# Patient Record
Sex: Female | Born: 1950 | Race: Black or African American | Hispanic: No | Marital: Married | Smoking: Former smoker
Health system: Southern US, Community
[De-identification: ages and names within clinical notes are randomized; demographics above are authoritative.]

## PROBLEM LIST (undated history)

## (undated) DIAGNOSIS — E785 Hyperlipidemia, unspecified: Secondary | ICD-10-CM

## (undated) DIAGNOSIS — D649 Anemia, unspecified: Secondary | ICD-10-CM

## (undated) DIAGNOSIS — I10 Essential (primary) hypertension: Secondary | ICD-10-CM

## (undated) DIAGNOSIS — M1990 Unspecified osteoarthritis, unspecified site: Secondary | ICD-10-CM

## (undated) HISTORY — PX: CHOLECYSTECTOMY: SHX55

## (undated) HISTORY — DX: Anemia, unspecified: D64.9

## (undated) HISTORY — PX: NECK SURGERY: SHX720

## (undated) HISTORY — PX: BREAST CYST EXCISION: SHX579

## (undated) HISTORY — PX: CARPAL TUNNEL RELEASE: SHX101

## (undated) HISTORY — DX: Hyperlipidemia, unspecified: E78.5

## (undated) HISTORY — DX: Unspecified osteoarthritis, unspecified site: M19.90

## (undated) HISTORY — PX: KNEE ARTHROSCOPY: SUR90

---

## 1997-02-18 ENCOUNTER — Ambulatory Visit (HOSPITAL_COMMUNITY): Admission: RE | Admit: 1997-02-18 | Discharge: 1997-02-18 | Payer: Self-pay | Admitting: Family Medicine

## 1997-03-02 ENCOUNTER — Ambulatory Visit (HOSPITAL_COMMUNITY): Admission: RE | Admit: 1997-03-02 | Discharge: 1997-03-02 | Payer: Self-pay | Admitting: Family Medicine

## 2000-01-22 ENCOUNTER — Ambulatory Visit (HOSPITAL_COMMUNITY): Admission: RE | Admit: 2000-01-22 | Discharge: 2000-01-22 | Payer: Self-pay | Admitting: Family Medicine

## 2000-01-22 ENCOUNTER — Encounter: Payer: Self-pay | Admitting: Family Medicine

## 2003-03-23 ENCOUNTER — Encounter: Admission: RE | Admit: 2003-03-23 | Discharge: 2003-06-21 | Payer: Self-pay | Admitting: Family Medicine

## 2003-05-04 ENCOUNTER — Ambulatory Visit (HOSPITAL_COMMUNITY): Admission: RE | Admit: 2003-05-04 | Discharge: 2003-05-04 | Payer: Self-pay | Admitting: Family Medicine

## 2005-01-14 HISTORY — PX: SPINE SURGERY: SHX786

## 2005-04-05 ENCOUNTER — Encounter: Admission: RE | Admit: 2005-04-05 | Discharge: 2005-04-05 | Payer: Self-pay | Admitting: Internal Medicine

## 2005-04-25 ENCOUNTER — Encounter: Admission: RE | Admit: 2005-04-25 | Discharge: 2005-05-30 | Payer: Self-pay | Admitting: *Deleted

## 2005-05-21 ENCOUNTER — Encounter: Admission: RE | Admit: 2005-05-21 | Discharge: 2005-05-21 | Payer: Self-pay | Admitting: Orthopedic Surgery

## 2005-06-07 ENCOUNTER — Encounter: Admission: RE | Admit: 2005-06-07 | Discharge: 2005-06-07 | Payer: Self-pay | Admitting: Orthopedic Surgery

## 2005-07-25 ENCOUNTER — Encounter: Admission: RE | Admit: 2005-07-25 | Discharge: 2005-07-25 | Payer: Self-pay | Admitting: Orthopedic Surgery

## 2005-09-27 ENCOUNTER — Observation Stay (HOSPITAL_COMMUNITY): Admission: RE | Admit: 2005-09-27 | Discharge: 2005-09-28 | Payer: Self-pay | Admitting: Specialist

## 2005-11-07 ENCOUNTER — Encounter: Admission: RE | Admit: 2005-11-07 | Discharge: 2005-12-13 | Payer: Self-pay | Admitting: *Deleted

## 2006-01-01 ENCOUNTER — Encounter: Admission: RE | Admit: 2006-01-01 | Discharge: 2006-01-01 | Payer: Self-pay | Admitting: Internal Medicine

## 2006-01-02 ENCOUNTER — Encounter: Admission: RE | Admit: 2006-01-02 | Discharge: 2006-02-12 | Payer: Self-pay | Admitting: Specialist

## 2007-09-01 ENCOUNTER — Encounter: Admission: RE | Admit: 2007-09-01 | Discharge: 2007-09-01 | Payer: Self-pay | Admitting: Internal Medicine

## 2007-10-15 ENCOUNTER — Encounter: Admission: RE | Admit: 2007-10-15 | Discharge: 2007-11-24 | Payer: Self-pay | Admitting: Internal Medicine

## 2008-03-17 ENCOUNTER — Inpatient Hospital Stay (HOSPITAL_COMMUNITY): Admission: EM | Admit: 2008-03-17 | Discharge: 2008-03-18 | Payer: Self-pay | Admitting: Emergency Medicine

## 2010-02-03 ENCOUNTER — Encounter: Payer: Self-pay | Admitting: Internal Medicine

## 2010-02-03 ENCOUNTER — Encounter: Payer: Self-pay | Admitting: Family Medicine

## 2010-02-04 ENCOUNTER — Encounter: Payer: Self-pay | Admitting: Internal Medicine

## 2010-04-26 LAB — LIPID PANEL
Cholesterol: 160 mg/dL (ref 0–200)
HDL: 58 mg/dL (ref >39)
Triglycerides: 66 mg/dL (ref <150)
VLDL: 13 mg/dL (ref 0–40)

## 2010-04-26 LAB — CARDIAC PANEL(CRET KIN+CKTOT+MB+TROPI)
CK, MB: 0.9 ng/mL (ref 0.3–4.0)
CK, MB: 1 ng/mL (ref 0.3–4.0)
Relative Index: INVALID (ref 0.0–2.5)
Relative Index: INVALID (ref 0.0–2.5)
Relative Index: INVALID (ref 0.0–2.5)
Total CK: 84 U/L (ref 7–177)
Troponin I: <0.01 ng/mL (ref 0.00–0.06)

## 2010-04-26 LAB — COMPREHENSIVE METABOLIC PANEL
ALT: 19 U/L (ref 0–35)
AST: 30 U/L (ref 0–37)
Albumin: 3.6 g/dL (ref 3.5–5.2)
Alkaline Phosphatase: 77 U/L (ref 39–117)
Alkaline Phosphatase: 89 U/L (ref 39–117)
BUN: 17 mg/dL (ref 6–23)
CO2: 25 mEq/L (ref 19–32)
Calcium: 8.8 mg/dL (ref 8.4–10.5)
Chloride: 102 mEq/L (ref 96–112)
Chloride: 106 mEq/L (ref 96–112)
Creatinine, Ser: 0.92 mg/dL (ref 0.4–1.2)
GFR calc Af Amer: >60 mL/min (ref >60)
GFR calc non Af Amer: >60 mL/min (ref >60)
Glucose, Bld: 123 mg/dL — ABNORMAL HIGH (ref 70–99)
Glucose, Bld: 136 mg/dL — ABNORMAL HIGH (ref 70–99)
Potassium: 3 mEq/L — ABNORMAL LOW (ref 3.5–5.1)
Sodium: 139 mEq/L (ref 135–145)
Total Bilirubin: 0.6 mg/dL (ref 0.3–1.2)
Total Bilirubin: 0.8 mg/dL (ref 0.3–1.2)
Total Protein: 5.8 g/dL — ABNORMAL LOW (ref 6.0–8.3)

## 2010-04-26 LAB — CBC
MCHC: 35.4 g/dL (ref 30.0–36.0)
RDW: 13.9 % (ref 11.5–15.5)

## 2010-04-26 LAB — POCT CARDIAC MARKERS
CKMB, poc: <1 ng/mL — ABNORMAL LOW (ref 1.0–8.0)
Myoglobin, poc: 55.8 ng/mL (ref 12–200)
Myoglobin, poc: 60.8 ng/mL (ref 12–200)
Troponin i, poc: <0.05 ng/mL (ref 0.00–0.09)
Troponin i, poc: <0.05 ng/mL (ref 0.00–0.09)

## 2010-04-26 LAB — DIFFERENTIAL
Eosinophils Absolute: 0.1 10*3/uL (ref 0.0–0.7)
Eosinophils Relative: 2 % (ref 0–5)
Lymphocytes Relative: 35 % (ref 12–46)
Neutro Abs: 3 10*3/uL (ref 1.7–7.7)

## 2010-04-26 LAB — TROPONIN I: Troponin I: <0.01 ng/mL (ref 0.00–0.06)

## 2010-04-26 LAB — GLUCOSE, CAPILLARY: Glucose-Capillary: 136 mg/dL — ABNORMAL HIGH (ref 70–99)

## 2010-04-26 LAB — CK TOTAL AND CKMB (NOT AT ARMC): Total CK: 103 U/L (ref 7–177)

## 2010-05-29 NOTE — Discharge Summary (Signed)
NAMEMarland Guerrero  ALICA, SHELLHAMMER NO.:  [id number]   MEDICAL RECORD NO.:  [id number]          PATIENT TYPE:  INP   LOCATION:  3729                         FACILITY:  [facility]   PHYSICIAN:  Peggye Pitt, M.D. DATE OF BIRTH:  [birth year]   DATE OF ADMISSION:  03/17/2008  DATE OF DISCHARGE:  03/18/2008                               DISCHARGE SUMMARY   DISCHARGE DIAGNOSES:  1. Chest pain ruled out for acute coronary syndrome, likely secondary      to gastroesophageal reflux disease.  2. Gastroesophageal reflux disease.  3. Hypertension.  4. History of type 2 diabetes mellitus.   DISCHARGE MEDICATIONS:  1. Aspirin 81 mg daily.  2. Metoprolol 12.5 mg twice daily.  3. Omeprazole 20 mg daily.  4. Actos 30 mg daily.  5. Januvia 100 mg daily.  6. Lisinopril/hydrochlorothiazide 20/12.5 mg daily.  7. Byetta 5 units twice daily.  8. Xanax 0.5 mg t.i.d. p.r.n.  9. Vicodin 5/500 mg 1 tablet every 6 hours as needed for pain.   DISPOSITION AND FOLLOW UP:  Madeline Guerrero is discharged home in stable  condition.  Of note, she has ruled out for an acute coronary syndrome,  but given her multiple coronary artery disease risk factors, I have  called the [facility] for them to arrange an outpatient stress  test.  They will provide the patient with this information prior to her  discharge.   CONSULTATION THIS HOSPITALIZATION:  None.   IMAGES AND PROCEDURES:  Images and procedures performed during this  hospitalization include a chest x-ray on March 17, 2008, that showed no  acute process.   HISTORY AND PHYSICAL:  For details refer to dictation by Dr. Flonnie Overman on  March 17, 2008, but in brief Madeline Guerrero is a 60 year old African  American woman, who has a history of hypertension, type 2 diabetes  mellitus as well as tobacco abuse, who presents to the hospital with  complaints of chest pain over her right chest and her precordial area  that she felt like a burning, was worse  when lying down accompanied by  mild shortness of breath.  Because of that reason, she decided to come  into the hospital for further evaluation and management.   HOSPITAL COURSE BY PROBLEM:  1. Chest pain.  She has ruled out for an acute coronary syndrome by      the means of 3 sets of negative cardiac enzymes as well as an EKG      that shows no acute ST-T wave changes.  Because she does have      several coronary artery disease risk factors including tobacco      abuse, hypertension, type 2 diabetes as well as family history, I      have decided to set her up for an outpatient stress test.  [facility] has been called and they will arrange this prior to her      discharge.  She has had further risk stratification with a fasting      lipid panel that shows a total cholesterol of 160, triglycerides of      66, HDL of 58, and  LDL of 89.  2. Hypertension.  She was kept on her lisinopril/hydrochlorothiazide      as well as metoprolol was added during this hospitalization.  She      is advised to see her primary care physician in about 2 weeks for      further adjustment of her antihypertensive regimen.  3. Type 2 diabetes mellitus.  She has had good CBG control while in      the hospital.  She has been continued on her Actos and Januvia.   Vital signs on day of discharge:  Blood pressure 143/78, heart rate 73,  respirations 20, and O2 sats 98% on room air with a temperature of 98.2.   Labs on day of discharge:  Sodium 139, potassium 3.6, chloride 106,  bicarb 28, BUN 17, creatinine 0.92 with a glucose of 136.      Peggye Pitt, M.D.  Electronically Signed     EH/MEDQ  D:  03/18/2008  T:  03/19/2008  Job:  [id number]   cc:   Ralene Ok, M.D.

## 2010-05-29 NOTE — H&P (Signed)
NAME:  Madeline Guerrero, Madeline Guerrero NO.:  [id number]   MEDICAL RECORD NO.:  [id number]          PATIENT TYPE:  EMS   LOCATION:  MAJO                         FACILITY:  [facility]   PHYSICIAN:  Lucita Ferrara, MD         DATE OF BIRTH:  Jan 18, 1950   DATE OF ADMISSION:  03/17/2008  DATE OF DISCHARGE:                              HISTORY & PHYSICAL   PRIMARY CARE PHYSICIAN:  Dr. Ludwig Clarks.   HISTORY OF PRESENT ILLNESS:  The patient is a 60 year old African  American female who presents with chest pain over the anterior  precordial area and mid sternal area, constant in nature, intermittent,  sharp as well.  It is at 10/10 in intensity.  It has eased off now.  Accompanied by mild shortness of breath.  It is squeezing in its  characterization.  It is not associated with coughing.  It is not  associated with gastroesophageal reflux disease.  Not reproducible.  No  fevers or chills.   REVIEW OF SYSTEMS:  Otherwise 12 point review of systems is negative.  As per HPI otherwise negative.   PAST MEDICAL HISTORY:  1. Significant for hypertension.  2. Diabetes.  3. History of chronic back pain.   PAST SURGICAL HISTORY:  1. Status post carpal tunnel surgery.  2. Status post cholecystectomy.  3. Status post disk surgery.  4. Status post knee surgery.   SOCIAL HISTORY:  The patient denies drugs or alcohol.  Former smoker.   ALLERGIES:  No known drug allergies.   MEDICATIONS:  1. Lisinopril/hydrochlorothiazide.  2. Actos.  3. Xanax.  4. Vicodin.  5. Byetta.  6. Januvia.   PHYSICAL EXAMINATION:  GENERAL:  The patient is in no acute distress.  HEENT:  Normocephalic, atraumatic.  Sclerae anicteric.  PERRLA.  Extraocular movements intact.  NECK:  Supple.  No JVD.  No carotid bruits.  CARDIOVASCULAR:  S1, S2 regular rate and rhythm.  No murmurs, rubs,  clicks.  LUNGS:  Clear to auscultation bilaterally with no rales or wheeze.  ABDOMEN:  Soft, nontender, nondistended.  Positive bowel  sounds.  EXTREMITIES:  No clubbing, cyanosis or edema.  NEUROLOGICAL:  The patient is alert and oriented x3.  Cranial nerves II-  XII are grossly intact.  VITAL SIGNS:  Blood pressure is 143/77, pulse 76, respirations 20,  temperature 97.6.   EKG shows normal sinus rhythm.  Normal ST-T waves.   LABORATORY DATA:  Troponins negative x2.  Lipase 48.  Complete metabolic  panel within normal limits.  CBC shows a hemoglobin 11.7.   ASSESSMENT:  The patient is a 60 year old with:  1. Chest pain.  Risk factors include hypertension, diabetes, tobacco      abuse.  2. Diabetes type 2.  3. Hypertension.  4. Tobacco abuse.  5. Obesity.   ASSESSMENT:  The patient will be admitted to a medical telemetry unit.  The cardiac enzymes will be cycled x3.  Rule out acute coronary  syndrome.  Aspirin 325 mg p.o. daily.  Beta-blockers with metoprolol.  The rest of the plans will be dependent on her progress.  We will also  institute tobacco cessation counseling and offer nicotine patch.  DVT  and GI prophylaxis with Lovenox and Protonix.      Lucita Ferrara, MD  Electronically Signed     RR/MEDQ  D:  03/17/2008  T:  03/17/2008  Job:  [id number]

## 2010-06-01 NOTE — Op Note (Signed)
NAME:  NORELL, BRISBIN NO.:  [id number]   MEDICAL RECORD NO.:  [id number]          PATIENT TYPE:  INP   LOCATION:  5040                         FACILITY:  [facility]   PHYSICIAN:  Kerrin Champagne, M.D.   DATE OF BIRTH:  1950/05/10   DATE OF PROCEDURE:  09/27/2005  DATE OF DISCHARGE:                                 OPERATIVE REPORT   PREOPERATIVE DIAGNOSES:  Cervical stenosis centrally, C4-5; and right carpal  tunnel syndrome.   POSTOPERATIVE DIAGNOSES:  Cervical stenosis centrally, C4-5; and right  carpal tunnel syndrome.   PROCEDURES:  Anterior cervical diskectomy and fusion, C4-5, with 7-mm  Transgraft and composite with local bone graft; internal fixation with 16-mm  Alphatec plate and 04-VW screws; right open carpal tunnel release.   SURGEON:  Kerrin Champagne, M.D.   ASSISTANT:  Wende Neighbors, P.A.-C.   ANESTHESIA:  General via orotracheal intubation; Dr. Judie Petit, Dr.  Diamantina Monks.  The patient did have infiltration of the right wrist with  Marcaine 0.5% plain, the left anterior neck with Marcaine 0.5% with  1:200,000 epinephrine.   SPECIMENS:  None.   ESTIMATED BLOOD LOSS:  50 cc combined.   COMPLICATIONS:  None.   TOTAL TOURNIQUET TIME:  At 250 mmHg, 22 minutes.   BRIEF CLINICAL HISTORY:  This patient is a 60 year old female, who has been  suffering from neck pain with radiation to the right arm.  This has been  going on continuously for the last 6 months, with severe worsening of pain  over the last month to month and a half.  She has radiation in the radial 3  digits of the right hand, and shoulder pain and scapular pain.  She  underwent studies, including an MRI study, which demonstrates moderate  cervical stenosis, C4-5, mild degenerative disk changes above and below this  segment, and a right side greater than left side carpal tunnel syndrome,  judged to be of moderate severity.  She has apparent double-crush type of  condition  involving the right upper extremity with nerve compression at the  level of the neck and the right wrist.  She is brought to the operating room  to undergo anterior cervical diskectomy and fusion at C4-5, with right open  carpal tunnel release.   INTRAOPERATIVE FINDINGS:  The patient was found to have moderate cervical  stenosis centrally at C4-5.  This was decompressed via an anterior cervical  diskectomy and fusion approach, with resection of posterior-lip osteophytes.  She then had open right carpal tunnel release and was found to have moderate-  to-severe carpal tunnel findings with flattening of the median nerve at the  level of the transverse carpal ligament.   DESCRIPTION OF PROCEDURE:  After adequate general anesthesia, the patient  pre anesthesia had placement of a central line via the right side, an IJ  line, by Dr. Judie Petit.  She underwent a standard prep with DuraPrep  solution of the anterior neck on the left side, draping out the previous  neck line on the right side.  She was in a beach-chair position with the  neck in slight extension with 5 pounds cervical halter traction,  and the  head sat on the [place] horseshoe well padded.  With the arms at the sides  initially, with the shoulder blocks in place, skids in place to keep the  arms from falling over the sides of the table, all pressure points were well  padded.  The patient had TED hose in place and these were continued  throughout the case.  After a standard prep over the anterior aspect of the  neck on the left side, she was draped in the usual manner.  Iodine Vi-Drape  was used.  With the incision line over the patient's skin crease on the left  side at the expected C4-C5 level, the incision approximately 6 to 7 cm in  length through the skin and subcutaneous layer was carried down to the  platysma layer.  This was then incised in line with the skin incision.  Large veins were identified and preserved.   Dissection was carried between  the carotid sheath laterally and the esophagus and trachea medially.  There  were noted to be several large veins crossing across the field, as middle  thyroid artery and vein.  These were carefully dissected out using a  Metzenbaum scissors, and then a right-angle clamp used to pass silk suture  about this vein and artery, tying it off appropriately and then dividing it.  Unfortunately, even with tying it off, the vein did prove to continue to  have episodic bleeding requiring use of the clamp and then retying off with  silk until, at last, the vein was well controlled.  The primary portion of  the internal jugular vein remained quite patent and quite well distended,  the artery uninvolved.  The esophagus and trachea were then carefully  retracted to the right side, and the anterior aspect of the cervical spine  identified, along with its prevertebral fascia.  This was incised in line  with the medial border of the longus colli muscle, and then teased across  the midline with the key elevator.  This exposed 2 disk spaces of the  anterior cervical region, and 18-gauge needles with the sheaths cut to only  allow a centimeter to protrude were then inserted into the said levels.  Intraoperative lateral radiograph demonstrated the needles at the C3-4 and  C4-5 levels.  Under careful inspection using hand-held Clowards, the upper  needle was then removed.  The lower needle was then removed, carefully  removing a portion of the disk using a 15-blade scalpel immediately after  its removal, and a pituitary rongeur used to remove a portion of the  anterior annulus to continue the identification of this area throughout the  remainder of the case.  The medial border of the longus colli muscle was  then carefully freed up using a key elevator and electrocautery where  necessary.  The Boss-McCullough retractor blades were then placed with the foot of the blades beneath  the medial border of the longus colli muscle in  order to allow for retraction and careful protection of soft tissue  structures medially and laterally.  With this in place, then a 14-mm screw  post was inserted into the body of C4, and a second parallel to that into  the body of C5.  Distraction was obtained across the disk space.  A 15-blade  scalpel was used to further incise the anterior disk.  Next, Kerrisons were  used to remove the anterior-lip osteophytes, and this bone material was  preserved and used for later packing of  the graft to be used.  After  removing the anterior-lip osteophytes, the endplates and disk spaces were  then curetted of degenerative disk material and cartilaginous endplates,  removing with pituitary rongeurs back to the posterior annulus.  The  operating room microscope was carefully draped and brought into the field  sterilely.  Under the operating room microscope, a high-speed bur was then  used to carefully decorticate the endplates over the inferior aspect of C4,  the superior aspect of C5, back to the posterior-lip osteophytes that were  present, and these were thinned.  Pituitary rongeurs were used to debride  posterior annular fibers.  A 1-mm Kerrison was then introduced and used to  resect the posteroinferior-lip osteophyte of C5 transversely from the left  to the right side, after first removing bone over the lip using a 3.0  microcuret.  After resecting this bone, then, the upper segment underwent  resection with thinning of the posterior-lip osteophytes and then resecting  transversely with a 1-mm Kerrison at the posterior-lip osteophyte base.  Foraminotomy was performed on the right and left sides, decompressing the C5  nerve roots on either side.  With this completed, irrigation was performed.  The height of the intervertebral disc space was measured at 7 mm using the  sounder provided with the set, the depth measuring about 20 mm.  The patient   then had Transgraft was reactivated and bone material that was obtained from  decortication of the bony portions of the endplates from C4-5 and of the  anterior-lip osteophytes were carefully morcellized and placed within the  hollow region of the central portion of the graft.  The 7-mm graft was then  placed over the disk space and carefully impacted into the disk space, after  careful inspection demonstrated there was no bony or soft tissue debris that  could be retropulsed with insertion of the graft.  With this completed, the  graft approximated the anterior aspect of the disk space.  The screw posts  were then removed at the C4 and C5 levels.  Distraction was removed off of  the patient's cervical spine through the cervical traction; the 5 pounds was  removed.  And a high-speed bur was then used to carefully remove any  residual osteophytes over the anterior aspect of the disk space at C4-5 and the superior aspect of C4-5.  A 16-mm locking plate was then placed over the  anterior disk space, carefully aligning it, and then, using a single  retaining pin in the lower left side of the plate to hold it in place at the  C5 segment.  A 14-mm drill bit was then used to carefully drill the first  hole on the left side at C4, and this was replaced by a 14-mm screw in the  right side at C4.  The retaining alignment pin was then removed carefully,  and a drill hole was placed on the left side at C5 and a screw placed, and  then on the right side at C5.  Deformation was then performed of the plate  in order to lock the plate to the screws using the screwdriver provided.  Intraoperative lateral radiograph demonstrated the superior aspect of the  plate and screws in excellent position and alignment, and the bone graft  well within the anterior 2/3 of the disk space in excellent position and  alignment.  No sign of retropulsion or impingement upon the posterior  structures noted.  The lower portion  of the  plate and screws was not easily  seen, and it was felt to be in adequate position and alignment, and that it  could be further checked in the recovery room.  Irrigation was then carried  out.  A small bleeder over the lateral aspect of the tracheoesophageal  region was carefully further tied off using a 2-0 silk tie.   With this completed, a #7-French TLS drain was placed through a separate  incision over the anteroinferior aspect of the neck, just anterior and  inferior to the incision site.  This was sewn in place with a 4-0 nylon  stitch, cut to appropriate length and placed down close to the plate to the  allow for drainage of this area.  Irrigation was performed.  Careful  inspection of the esophagus demonstrated no abnormalities.  The platysma  layer was reapproximated with interrupted 3-0 Vicryl sutures.  The subcu  layer was approximated with interrupted 3-0 Vicryl sutures, and the skin  closed with interrupted 3-0 Vicryl sutures.  Further closure of the skin was  then carried out using Dermabond, carefully protecting the drain.  A 4 x 4  was fixed to the skin with paper tape.  A soft collar was then applied.  With the neck carefully stabilized and the drain in good position and well  charged, the right upper extremity was then placed on a right arm table.   A tourniquet about the right upper using Webril to protect the skin.  Standard prep with DuraPrep solution of the right hand, fingers, to the  right elbow.  Draped in the usual manner.  The patient had elevation of the  right upper extremity and exsanguination using an Esmarch bandage.  Tourniquet inflated to 250 mmHg.  With this inflated, then the expected  incision line was carried just ulnar to the thenar crease base at the base  of the thumb.  The expected incision length was between 3 and 4 cm.  Curved  across the transverse wrist creases ulnarward.  This was in line with the fourth digit of the hand.  The incision  was then made, following the  injection of the subcu layers with Marcaine 0.5% plain 6 cc.  Incision  through skin and subcu layers directly down to the transverse carpal  ligament overlying the carpus.  Proximally, incision to the superficial  fascial layer of the distal forearm.  The fascia was then incised and a  sharp scissors then used to carefully incise further the distal forearm  fascia.  Then, carefully continuing the dissection in the incision, the  fascial incision was carried to the level of the transverse process.  A  Freer elevator then passed beneath the transverse carpal ligament to protect  the underlying median nerve, and the transverse carpal ligament was then  incised using a 15-blade scalpel.  Then, further sharp scissors and  Metzenbaum were used to divide the ligament and continue the incision of the  palmar fascia out to the level of the superficial palmar arch.  With this  completed, then, the carpal tunnel release was then completed distally.  The  proximal portion of the carpal tunnel in the distal forearm fascia was then  incised further, first freeing up the tissue overlying the fascia  proximally, and then incising more proximally, holding up the skin edges,  using a Photographer.  Metzenbaum scissors were used to release the fascia  distally.  With this completed, the tourniquet was released.  Total  tourniquet time was  20 minutes.  Bleeders were then controlled using bipolar  electrocautery and pressure.  The tourniquet was removed to prevent any  venous tourniquet effect.  There was no active bleeding present.  Then, the  skin edges were approximated with interrupted horizontal mattress sutures of  4-0 nylon.  Adaptic was applied, 4 x  4's affixed to the skin with sterile Webril, and a well-padded volar splint  applied with the wrist locked up into dorsiflexion of about 25 degrees.  The  patient was then reactivated, returned to her bed, extubated and  returned to  the recovery room in satisfactory condition.  All instrument and sponge  counts were correct.      Kerrin Champagne, M.D.  Electronically Signed     JEN/MEDQ  D:  09/27/2005  T:  09/27/2005  Job:  [id number]

## 2011-01-11 ENCOUNTER — Emergency Department (HOSPITAL_COMMUNITY): Payer: 59

## 2011-01-11 ENCOUNTER — Encounter: Payer: Self-pay | Admitting: Emergency Medicine

## 2011-01-11 ENCOUNTER — Emergency Department (HOSPITAL_COMMUNITY)
Admission: EM | Admit: 2011-01-11 | Discharge: 2011-01-11 | Disposition: A | Discharge status: Home / Self Care | Payer: 59 | Attending: Emergency Medicine | Admitting: Emergency Medicine

## 2011-01-11 DIAGNOSIS — Z87891 Personal history of nicotine dependence: Secondary | ICD-10-CM | POA: Insufficient documentation

## 2011-01-11 DIAGNOSIS — R112 Nausea with vomiting, unspecified: Secondary | ICD-10-CM | POA: Insufficient documentation

## 2011-01-11 DIAGNOSIS — R197 Diarrhea, unspecified: Secondary | ICD-10-CM | POA: Insufficient documentation

## 2011-01-11 DIAGNOSIS — R059 Cough, unspecified: Secondary | ICD-10-CM | POA: Insufficient documentation

## 2011-01-11 DIAGNOSIS — E119 Type 2 diabetes mellitus without complications: Secondary | ICD-10-CM | POA: Insufficient documentation

## 2011-01-11 DIAGNOSIS — R05 Cough: Secondary | ICD-10-CM | POA: Insufficient documentation

## 2011-01-11 DIAGNOSIS — I10 Essential (primary) hypertension: Secondary | ICD-10-CM | POA: Insufficient documentation

## 2011-01-11 HISTORY — DX: Essential (primary) hypertension: I10

## 2011-01-11 LAB — COMPREHENSIVE METABOLIC PANEL
Albumin: 3.6 g/dL (ref 3.5–5.2)
BUN: 19 mg/dL (ref 6–23)
Chloride: 101 mEq/L (ref 96–112)
Creatinine, Ser: 0.82 mg/dL (ref 0.50–1.10)
Total Bilirubin: 0.1 mg/dL — ABNORMAL LOW (ref 0.3–1.2)
Total Protein: 7.2 g/dL (ref 6.0–8.3)

## 2011-01-11 LAB — DIFFERENTIAL
Basophils Relative: 0 % (ref 0–1)
Eosinophils Absolute: 0.2 10*3/uL (ref 0.0–0.7)
Eosinophils Relative: 3 % (ref 0–5)
Monocytes Absolute: 0.5 10*3/uL (ref 0.1–1.0)
Monocytes Relative: 8 % (ref 3–12)

## 2011-01-11 LAB — CBC
HCT: 35.5 % — ABNORMAL LOW (ref 36.0–46.0)
Hemoglobin: 12 g/dL (ref 12.0–15.0)
MCH: 33.9 pg (ref 26.0–34.0)
MCHC: 33.8 g/dL (ref 30.0–36.0)

## 2011-01-11 MED ORDER — ONDANSETRON 8 MG PO TBDP
8.0000 mg | ORAL_TABLET | Freq: Once | ORAL | Status: AC
  Administered 2011-01-11: 8 mg via ORAL
  Filled 2011-01-11: qty 1

## 2011-01-11 MED ORDER — ONDANSETRON HCL 4 MG/2ML IJ SOLN
4.0000 mg | Freq: Once | INTRAMUSCULAR | Status: DC
  Filled 2011-01-11: qty 2

## 2011-01-11 MED ORDER — LOPERAMIDE HCL 2 MG PO CAPS
4.0000 mg | ORAL_CAPSULE | Freq: Once | ORAL | Status: AC
  Administered 2011-01-11: 4 mg via ORAL
  Filled 2011-01-11: qty 2

## 2011-01-11 MED ORDER — ONDANSETRON HCL 4 MG PO TABS: 4.0000 mg | ORAL_TABLET | Freq: Four times a day (QID) | ORAL | Status: AC

## 2011-01-11 MED ORDER — SODIUM CHLORIDE 0.9 % IV SOLN: Freq: Once | INTRAVENOUS | Status: DC

## 2011-01-11 NOTE — ED Notes (Signed)
Pt c/o nausea and vomiting x 1 this am. Pt also c/o diarrhea x 4. Alert and oriented x 3. Skin warm and dry. Color pink. Breath sounds clear and equal bilaterally. Abdomen soft and non distended. Bowel sounds hyperactive.

## 2011-01-11 NOTE — ED Notes (Signed)
Unable to obtain IV access after 3 attempts. PA notified and orders received to try po medications.

## 2011-01-11 NOTE — ED Notes (Signed)
Took patient vitals. She was comfortable. Urine sample is needed. Will notify Doctor to see if she can have any fluids to stimulate urine production.

## 2011-01-11 NOTE — ED Provider Notes (Signed)
History     CSN: [id number]  Arrival date & time 01/11/11  4098   First MD Initiated Contact with Patient 01/11/11 [phone]      Chief Complaint  Patient presents with  . Nausea  . Emesis    (Consider location/radiation/quality/duration/timing/severity/associated sxs/prior treatment) Patient is a 60 y.o. female presenting with vomiting. The history is provided by the patient. No language interpreter was used.  Emesis  This is a new problem. The current episode started 1 to 2 hours ago. The problem has been gradually improving. The emesis has an appearance of bilious material. There has been no fever. Associated symptoms include cough and diarrhea. Pertinent negatives include no abdominal pain, no chills, no fever and no sweats. Associated symptoms comments: Nausea and vomiting x 2 .  No vomiting or diarrhea since ~ hr PTA in ED  Has not taken any med..    Past Medical History  Diagnosis Date  . Diabetes mellitus   . Hypertension     Past Surgical History  Procedure Date  . Cholecystectomy   . Carpal tunnel release   . Neck surgery     History reviewed. No pertinent family history.  History  Substance Use Topics  . Smoking status: Former Games developer  . Smokeless tobacco: Not on file  . Alcohol Use: No    OB History    Grav Para Term Preterm Abortions TAB SAB Ect Mult Living                  Review of Systems  Constitutional: Negative for fever and chills.  Respiratory: Positive for cough. Negative for shortness of breath, wheezing and stridor.        Mild NP cough  Cardiovascular: Negative for chest pain, palpitations and leg swelling.  Gastrointestinal: Positive for nausea, vomiting and diarrhea. Negative for abdominal pain, blood in stool and anal bleeding.  Genitourinary: Negative for dysuria, urgency, frequency, flank pain, vaginal discharge, vaginal pain and pelvic pain.  All other systems reviewed and are negative.    Allergies  Review of patient's allergies  indicates no known allergies.  Home Medications  No current outpatient prescriptions on file.  BP 125/77  Pulse 93  Temp 98 F (36.7 C)  Resp 20  Ht 5\' 4"  (1.626 m)  Wt 240 lb (108.863 kg)  BMI 41.20 kg/m2  SpO2 95%  Physical Exam  Nursing note and vitals reviewed. Constitutional: Madeline Guerrero is oriented to person, place, and time. Madeline Guerrero appears well-developed and well-nourished. Madeline Guerrero is cooperative.  Non-toxic appearance. Madeline Guerrero does not have a sickly appearance. Madeline Guerrero does not appear ill. No distress.  HENT:  Head: Normocephalic and atraumatic.  Right Ear: External ear normal.  Left Ear: External ear normal.  Nose: Nose normal.  Mouth/Throat: Oropharynx is clear and moist. No oropharyngeal exudate.  Eyes: EOM are normal.  Neck: Normal range of motion.  Cardiovascular: Normal rate, regular rhythm and normal heart sounds.   Pulmonary/Chest: Effort normal and breath sounds normal. No respiratory distress. Madeline Guerrero has no decreased breath sounds. Madeline Guerrero has no wheezes. Madeline Guerrero has no rhonchi. Madeline Guerrero has no rales. Madeline Guerrero exhibits no tenderness.  Abdominal: Soft. Bowel sounds are normal. Madeline Guerrero exhibits no distension, no fluid wave, no ascites, no pulsatile midline mass and no mass. There is no hepatosplenomegaly. There is tenderness. There is no rebound, no guarding and no CVA tenderness. No hernia.  Musculoskeletal: Normal range of motion.  Neurological: Madeline Guerrero is alert and oriented to person, place, and time. No cranial nerve deficit. Coordination  normal.  Skin: Skin is warm and dry. Madeline Guerrero is not diaphoretic.  Psychiatric: Madeline Guerrero has a normal mood and affect. Her behavior is normal. Judgment and thought content normal.    ED Course  Procedures (including critical care time)   Labs Reviewed  CBC  DIFFERENTIAL  COMPREHENSIVE METABOLIC PANEL  URINALYSIS, ROUTINE W REFLEX MICROSCOPIC   No results found.   No diagnosis found.    MDM   1140-pt states Madeline Guerrero feels much better.  Wants to go home.       Worthy Rancher, PA 01/11/11 [phone]

## 2011-01-11 NOTE — ED Notes (Signed)
Pt c/o cough/congestion x 1 week with earache since yesterday and n/v/d this am.

## 2011-01-11 NOTE — ED Notes (Signed)
Patient is being discharged

## 2011-01-11 NOTE — ED Notes (Signed)
Pt says she feels much better,  Sitting up on side of bed.

## 2011-01-12 NOTE — ED Provider Notes (Signed)
Medical screening examination/treatment/procedure(s) were conducted as a shared visit with non-physician practitioner(s) and myself.  I personally evaluated the patient during the encounter.the patient is non toxic .   No acute abd  Donnetta Hutching, MD 01/12/11 1315

## 2011-03-07 ENCOUNTER — Ambulatory Visit (INDEPENDENT_AMBULATORY_CARE_PROVIDER_SITE_OTHER): Payer: 59

## 2011-03-08 ENCOUNTER — Ambulatory Visit (INDEPENDENT_AMBULATORY_CARE_PROVIDER_SITE_OTHER): Payer: 59 | Admitting: Family Medicine

## 2011-03-08 VITALS — BP 144/77 | HR 96 | Temp 98.0°F | Resp 16 | Ht 63.5 in | Wt 230.0 lb

## 2011-03-08 DIAGNOSIS — F32A Depression, unspecified: Secondary | ICD-10-CM

## 2011-03-08 DIAGNOSIS — J40 Bronchitis, not specified as acute or chronic: Secondary | ICD-10-CM

## 2011-03-08 DIAGNOSIS — E119 Type 2 diabetes mellitus without complications: Secondary | ICD-10-CM

## 2011-03-08 DIAGNOSIS — F329 Major depressive disorder, single episode, unspecified: Secondary | ICD-10-CM | POA: Insufficient documentation

## 2011-03-08 DIAGNOSIS — F411 Generalized anxiety disorder: Secondary | ICD-10-CM

## 2011-03-08 DIAGNOSIS — F419 Anxiety disorder, unspecified: Secondary | ICD-10-CM | POA: Insufficient documentation

## 2011-03-08 MED ORDER — BUTALBITAL-APAP-CAFFEINE 50-325-40 MG PO TABS: 1.0000 | ORAL_TABLET | Freq: Two times a day (BID) | ORAL | Status: DC | PRN

## 2011-03-08 MED ORDER — SITAGLIPTIN PHOSPHATE 50 MG PO TABS: 50.0000 mg | ORAL_TABLET | Freq: Every day | ORAL | Status: DC

## 2011-03-08 MED ORDER — ALPRAZOLAM 0.5 MG PO TABS: 0.5000 mg | ORAL_TABLET | Freq: Three times a day (TID) | ORAL | Status: AC | PRN

## 2011-03-08 MED ORDER — AZITHROMYCIN 250 MG PO TABS: ORAL_TABLET | ORAL | Status: DC

## 2011-03-08 NOTE — Progress Notes (Signed)
Madeline Guerrero has been on Actos, Januvia, and Metformin until 3 months ago when Actos was discontinued due to concerns over side effects.  She has lost 13 lbs but the sugar control has worsened.  She is open to increasing the Januvia.  There have been no side effects with the Januvia.   She is married to [employer].  The patient is also having a stressful time because of work. [facility] recently let her go stating that she did not seem to care enough about her job and so she is having to travel to work 91 miles to another hospital for work.  She has been on Paxil and had been on xanax for years, but weaned off xanax in the past 6 months.  Now she feels she needs to restart.  Patient seems reasonable and this seems to be a reasonable request.  Also, 3 weeks of cough of cough without fever.  O:  HEENT negative Chest:  Coarse BS  A:  Uncontrolled BS Bronchitis Stress P:  Increase Januvia Zpack Xanax .5 tid prn followup 3 month

## 2011-03-29 ENCOUNTER — Other Ambulatory Visit: Payer: Self-pay | Admitting: Family Medicine

## 2011-04-05 ENCOUNTER — Ambulatory Visit (INDEPENDENT_AMBULATORY_CARE_PROVIDER_SITE_OTHER): Payer: 59 | Admitting: Family Medicine

## 2011-04-05 VITALS — BP 137/82 | HR 99 | Temp 98.7°F | Resp 18 | Ht 63.5 in | Wt 235.8 lb

## 2011-04-05 DIAGNOSIS — IMO0001 Reserved for inherently not codable concepts without codable children: Secondary | ICD-10-CM

## 2011-04-05 DIAGNOSIS — M549 Dorsalgia, unspecified: Secondary | ICD-10-CM

## 2011-04-05 DIAGNOSIS — M545 Low back pain, unspecified: Secondary | ICD-10-CM

## 2011-04-05 MED ORDER — SAXAGLIPTIN HCL 5 MG PO TABS: 5.0000 mg | ORAL_TABLET | Freq: Every day | ORAL | Status: DC

## 2011-04-05 MED ORDER — HYDROCODONE-ACETAMINOPHEN 7.5-300 MG PO TABS: 1.0000 | ORAL_TABLET | ORAL | Status: DC | PRN

## 2011-04-05 NOTE — Patient Instructions (Signed)
Health Maintenance, Females A healthy lifestyle and preventative care can promote health and wellness.  Maintain regular health, dental, and eye exams.   Eat a healthy diet. Foods like vegetables, fruits, whole grains, low-fat dairy products, and lean protein foods contain the nutrients you need without too many calories. Decrease your intake of foods high in solid fats, added sugars, and salt. Get information about a proper diet from your caregiver, if necessary.   Regular physical exercise is one of the most important things you can do for your health. Most adults should get at least 150 minutes of moderate-intensity exercise (any activity that increases your heart rate and causes you to sweat) each week. In addition, most adults need muscle-strengthening exercises on 2 or more days a week.    Maintain a healthy weight. The body mass index (BMI) is a screening tool to identify possible weight problems. It provides an estimate of body fat based on height and weight. Your caregiver can help determine your BMI, and can help you achieve or maintain a healthy weight. For adults 20 years and older:   A BMI below 18.5 is considered underweight.   A BMI of 18.5 to 24.9 is normal.   A BMI of 25 to 29.9 is considered overweight.   A BMI of 30 and above is considered obese.   Maintain normal blood lipids and cholesterol by exercising and minimizing your intake of saturated fat. Eat a balanced diet with plenty of fruits and vegetables. Blood tests for lipids and cholesterol should begin at age 20 and be repeated every 5 years. If your lipid or cholesterol levels are high, you are over 50, or you are a high risk for heart disease, you may need your cholesterol levels checked more frequently.Ongoing high lipid and cholesterol levels should be treated with medicines if diet and exercise are not effective.   If you smoke, find out from your caregiver how to quit. If you do not use tobacco, do not start.    If you are pregnant, do not drink alcohol. If you are breastfeeding, be very cautious about drinking alcohol. If you are not pregnant and choose to drink alcohol, do not exceed 1 drink per day. One drink is considered to be 12 ounces (355 mL) of beer, 5 ounces (148 mL) of wine, or 1.5 ounces (44 mL) of liquor.   Avoid use of street drugs. Do not share needles with anyone. Ask for help if you need support or instructions about stopping the use of drugs.   High blood pressure causes heart disease and increases the risk of stroke. Blood pressure should be checked at least every 1 to 2 years. Ongoing high blood pressure should be treated with medicines, if weight loss and exercise are not effective.   If you are 55 to 60 years old, ask your caregiver if you should take aspirin to prevent strokes.   Diabetes screening involves taking a blood sample to check your fasting blood sugar level. This should be done once every 3 years, after age 45, if you are within normal weight and without risk factors for diabetes. Testing should be considered at a younger age or be carried out more frequently if you are overweight and have at least 1 risk factor for diabetes.   Breast cancer screening is essential preventative care for women. You should practice "breast self-awareness." This means understanding the normal appearance and feel of your breasts and may include breast self-examination. Any changes detected, no matter how   small, should be reported to a caregiver. Women in their 20s and 30s should have a clinical breast exam (CBE) by a caregiver as part of a regular health exam every 1 to 3 years. After age 40, women should have a CBE every year. Starting at age 40, women should consider having a mammogram (breast X-ray) every year. Women who have a family history of breast cancer should talk to their caregiver about genetic screening. Women at a high risk of breast cancer should talk to their caregiver about having  an MRI and a mammogram every year.   The Pap test is a screening test for cervical cancer. Women should have a Pap test starting at age 21. Between ages 21 and 29, Pap tests should be repeated every 2 years. Beginning at age 30, you should have a Pap test every 3 years as long as the past 3 Pap tests have been normal. If you had a hysterectomy for a problem that was not cancer or a condition that could lead to cancer, then you no longer need Pap tests. If you are between ages 65 and 70, and you have had normal Pap tests going back 10 years, you no longer need Pap tests. If you have had past treatment for cervical cancer or a condition that could lead to cancer, you need Pap tests and screening for cancer for at least 20 years after your treatment. If Pap tests have been discontinued, risk factors (such as a new sexual partner) need to be reassessed to determine if screening should be resumed. Some women have medical problems that increase the chance of getting cervical cancer. In these cases, your caregiver may recommend more frequent screening and Pap tests.   The human papillomavirus (HPV) test is an additional test that may be used for cervical cancer screening. The HPV test looks for the virus that can cause the cell changes on the cervix. The cells collected during the Pap test can be tested for HPV. The HPV test could be used to screen women aged 30 years and older, and should be used in women of any age who have unclear Pap test results. After the age of 30, women should have HPV testing at the same frequency as a Pap test.   Colorectal cancer can be detected and often prevented. Most routine colorectal cancer screening begins at the age of 50 and continues through age 75. However, your caregiver may recommend screening at an earlier age if you have risk factors for colon cancer. On a yearly basis, your caregiver may provide home test kits to check for hidden blood in the stool. Use of a small camera at  the end of a tube, to directly examine the colon (sigmoidoscopy or colonoscopy), can detect the earliest forms of colorectal cancer. Talk to your caregiver about this at age 50, when routine screening begins. Direct examination of the colon should be repeated every 5 to 10 years through age 75, unless early forms of pre-cancerous polyps or small growths are found.   Hepatitis C blood testing is recommended for all people born from 1945 through 1965 and any individual with known risks for hepatitis C.   Practice safe sex. Use condoms and avoid high-risk sexual practices to reduce the spread of sexually transmitted infections (STIs). Sexually active women aged 25 and younger should be checked for Chlamydia, which is a common sexually transmitted infection. Older women with new or multiple partners should also be tested for Chlamydia. Testing for other   STIs is recommended if you are sexually active and at increased risk.   Osteoporosis is a disease in which the bones lose minerals and strength with aging. This can result in serious bone fractures. The risk of osteoporosis can be identified using a bone density scan. Women ages 42 and over and women at risk for fractures or osteoporosis should discuss screening with their caregivers. Ask your caregiver whether you should be taking a calcium supplement or vitamin D to reduce the rate of osteoporosis.   Menopause can be associated with physical symptoms and risks. Hormone replacement therapy is available to decrease symptoms and risks. You should talk to your caregiver about whether hormone replacement therapy is right for you.   Use sunscreen with a sun protection factor (SPF) of 30 or greater. Apply sunscreen liberally and repeatedly throughout the day. You should seek shade when your shadow is shorter than you. Protect yourself by wearing long sleeves, pants, a wide-brimmed hat, and sunglasses year round, whenever you are outdoors.   Notify your caregiver  of new moles or changes in moles, especially if there is a change in shape or color. Also notify your caregiver if a mole is larger than the size of a pencil eraser.   Stay current with your immunizations.  Document Released: 07/16/2010 Document Revised: 12/20/2010 Document Reviewed: 07/16/2010 [facility] Patient Information 2012 [place], [place].Hypertension As your heart beats, it forces blood through your arteries. This force is your blood pressure. If the pressure is too high, it is called hypertension (HTN) or high blood pressure. HTN is dangerous because you may have it and not know it. High blood pressure may mean that your heart has to work harder to pump blood. Your arteries may be narrow or stiff. The extra work puts you at risk for heart disease, stroke, and other problems.  Blood pressure consists of two numbers, a higher number over a lower, 110/72, for example. It is stated as "110 over 72." The ideal is below 120 for the top number (systolic) and under 80 for the bottom (diastolic). Write down your blood pressure today. You should pay close attention to your blood pressure if you have certain conditions such as:  Heart failure.   Prior heart attack.   Diabetes   Chronic kidney disease.   Prior stroke.   Multiple risk factors for heart disease.  To see if you have HTN, your blood pressure should be measured while you are seated with your arm held at the level of the heart. It should be measured at least twice. A one-time elevated blood pressure reading (especially in the Emergency Department) does not mean that you need treatment. There may be conditions in which the blood pressure is different between your right and left arms. It is important to see your caregiver soon for a recheck. Most people have essential hypertension which means that there is not a specific cause. This type of high blood pressure may be lowered by changing lifestyle factors such as:  Stress.   Smoking.    Lack of exercise.   Excessive weight.   Drug/tobacco/alcohol use.   Eating less salt.  Most people do not have symptoms from high blood pressure until it has caused damage to the body. Effective treatment can often prevent, delay or reduce that damage. TREATMENT  When a cause has been identified, treatment for high blood pressure is directed at the cause. There are a large number of medications to treat HTN. These fall into several categories, and  your caregiver will help you select the medicines that are best for you. Medications may have side effects. You should review side effects with your caregiver. If your blood pressure stays high after you have made lifestyle changes or started on medicines,   Your medication(s) may need to be changed.   Other problems may need to be addressed.   Be certain you understand your prescriptions, and know how and when to take your medicine.   Be sure to follow up with your caregiver within the time frame advised (usually within two weeks) to have your blood pressure rechecked and to review your medications.   If you are taking more than one medicine to lower your blood pressure, make sure you know how and at what times they should be taken. Taking two medicines at the same time can result in blood pressure that is too low.  SEEK IMMEDIATE MEDICAL CARE IF:  You develop a severe headache, blurred or changing vision, or confusion.   You have unusual weakness or numbness, or a faint feeling.   You have severe chest or abdominal pain, vomiting, or breathing problems.  MAKE SURE YOU:   Understand these instructions.   Will watch your condition.   Will get help right away if you are not doing well or get worse.  Document Released: 12/31/2004 Document Revised: 12/20/2010 Document Reviewed: 08/21/2007 [facility] Patient Information 2012 [place], [place].

## 2011-04-05 NOTE — Progress Notes (Signed)
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 She also has chronic low back pain and needs refill on her pain medicine. Her insurance no longer covers Januvia but it does cover on clyse a. The [employer] some nausea which has helped her with her weight loss she's lost 10 pounds since her last visit. She knows she is due for a physical exam and plans to make an appointment in the next month.  The back pain is unchanged, persists in the lumbar region without radicular symptoms. She has no numbness or weakness in her lower extremities. No nausea, she has no GI complaints. She has no urinary complaints at present. She denies headache sore throat cough or chest pain.  Objective: No acute distress very pleasant lady who is well-informed about her diabetes.  HEENT: Unremarkable  Chest: Clear to auscultation  Heart: 2/6 systolic ejection type murmur best heard at the right heart border border  Abdomen: Soft nontender without HSM or masses.  Extremities: No edema, good pulses  Assessment: Stable at present but does need to continue her weight loss I think we are going to need to further evaluate the heart murmur which comes in for physical.  Plan: Physical exam in the next 6 weeks, refill diabetes medicine with on clyse 5 mg daily. I also refilled her back medicine

## 2011-05-03 ENCOUNTER — Telehealth: Payer: Self-pay

## 2011-05-03 ENCOUNTER — Other Ambulatory Visit: Payer: Self-pay | Admitting: Family Medicine

## 2011-05-03 NOTE — Telephone Encounter (Signed)
Can you please advise on this

## 2011-05-03 NOTE — Telephone Encounter (Signed)
Pt calling because she has only 2 bp pills left she was in last month and was sure Dr Milus Glazier wrote her another rx for her bp meds, she is leaving in a couple of hours to go out of town to work and will be gone 5 days and she is needing bp meds, she states she cant keep coming in every time to get her pb meds. Can someone please contact her because she is positive that Dr Milus Glazier wrote 2 rx for bp meds.

## 2011-05-04 NOTE — Telephone Encounter (Signed)
Madeline Guerrero sent in a 90 day rx to her pharmacy today. Called pt, she will check with her pharmacy to see if its there

## 2011-05-04 NOTE — Telephone Encounter (Signed)
Please refill patient's meds for another 2 months

## 2011-05-11 ENCOUNTER — Other Ambulatory Visit: Payer: Self-pay | Admitting: Family Medicine

## 2011-06-03 ENCOUNTER — Other Ambulatory Visit: Payer: Self-pay | Admitting: Physician Assistant

## 2011-06-14 ENCOUNTER — Other Ambulatory Visit: Payer: Self-pay | Admitting: Physician Assistant

## 2011-06-23 ENCOUNTER — Ambulatory Visit (INDEPENDENT_AMBULATORY_CARE_PROVIDER_SITE_OTHER): Payer: 59 | Admitting: Family Medicine

## 2011-06-23 VITALS — BP 121/79 | HR 86 | Temp 98.5°F | Resp 18 | Wt 228.0 lb

## 2011-06-23 DIAGNOSIS — E349 Endocrine disorder, unspecified: Secondary | ICD-10-CM

## 2011-06-23 DIAGNOSIS — F419 Anxiety disorder, unspecified: Secondary | ICD-10-CM

## 2011-06-23 DIAGNOSIS — E119 Type 2 diabetes mellitus without complications: Secondary | ICD-10-CM

## 2011-06-23 DIAGNOSIS — R42 Dizziness and giddiness: Secondary | ICD-10-CM

## 2011-06-23 DIAGNOSIS — I10 Essential (primary) hypertension: Secondary | ICD-10-CM

## 2011-06-23 DIAGNOSIS — M129 Arthropathy, unspecified: Secondary | ICD-10-CM

## 2011-06-23 DIAGNOSIS — M1990 Unspecified osteoarthritis, unspecified site: Secondary | ICD-10-CM

## 2011-06-23 DIAGNOSIS — F329 Major depressive disorder, single episode, unspecified: Secondary | ICD-10-CM

## 2011-06-23 DIAGNOSIS — R197 Diarrhea, unspecified: Secondary | ICD-10-CM

## 2011-06-23 DIAGNOSIS — M549 Dorsalgia, unspecified: Secondary | ICD-10-CM

## 2011-06-23 DIAGNOSIS — F411 Generalized anxiety disorder: Secondary | ICD-10-CM

## 2011-06-23 DIAGNOSIS — F32A Depression, unspecified: Secondary | ICD-10-CM

## 2011-06-23 DIAGNOSIS — D649 Anemia, unspecified: Secondary | ICD-10-CM

## 2011-06-23 LAB — POCT GLYCOSYLATED HEMOGLOBIN (HGB A1C): Hemoglobin A1C: 6.7

## 2011-06-23 LAB — POCT CBC
Hemoglobin: 13.3 g/dL (ref 12.2–16.2)
Lymph, poc: 3.4 (ref 0.6–3.4)
MCH, POC: 31.7 pg — AB (ref 27–31.2)
MCHC: 31.9 g/dL (ref 31.8–35.4)
MPV: 9.9 fL (ref 0–99.8)
POC MID %: 10 %M (ref 0–12)
WBC: 7.5 10*3/uL (ref 4.6–10.2)

## 2011-06-23 MED ORDER — LISINOPRIL-HYDROCHLOROTHIAZIDE 20-25 MG PO TABS: 1.0000 | ORAL_TABLET | Freq: Every day | ORAL | Status: DC

## 2011-06-23 MED ORDER — ALPRAZOLAM 0.5 MG PO TABS: ORAL_TABLET | ORAL | Status: DC

## 2011-06-23 MED ORDER — MECLIZINE HCL 25 MG PO TABS: ORAL_TABLET | ORAL | Status: DC

## 2011-06-23 MED ORDER — MELOXICAM 15 MG PO TABS: 15.0000 mg | ORAL_TABLET | Freq: Every day | ORAL | Status: DC

## 2011-06-23 MED ORDER — HYDROCODONE-ACETAMINOPHEN 7.5-300 MG PO TABS: ORAL_TABLET | ORAL | Status: DC

## 2011-06-23 MED ORDER — PAROXETINE HCL 20 MG PO TABS: 20.0000 mg | ORAL_TABLET | ORAL | Status: DC

## 2011-06-23 NOTE — Patient Instructions (Signed)
If not better return

## 2011-06-23 NOTE — Progress Notes (Signed)
Subjective: Patient went to see a diabetic weight loss clinic last week and started on a diet. He is a protein supplemented low-calorie shake type diet. Midweek she developed diarrhea and nausea. She had a rotten egg-type burps. SHe did not vomit. She's had abdominal discomfort but not a lot of pain. Her sugars have been running in the low 100 range. She has had a swooshing in her ears. The weight loss place called her to tell her that she was anemic. They had her take iron.  Review of systems: Prevascular unremarkable Respiratory unremarkable Gastrointestinal: Diarrhea as noted above GU unremarkable Musculoskeletal chronic pain problem Dermatologic unremarkable Constitutional just doesn't feel well. Psychiatric has depression and anxiety  A long discussion about her medications. She needs a lot of things refilled. We went through each one of them   Objective: Overweight lady in no major distress. TMs normal. Throat clear. Mildly anemic appearing conjunctiva.. Neck supple without significant noche to auscultation. Heart regular without murmurs. Chest  Heartregular without murmurs. Abdomen soft, nontender. Skin does not feel dehydrated.  Assessment: Diarrhea and abdominal pain secondary to diet Diabetes pulsetile tinnitus History of anemia  Plan: CBC, CMP   Results for orders placed in visit on 06/23/11  POCT CBC      Component Value Range   WBC 7.5  4.6 - 10.2 (K/uL)   Lymph, poc 3.4  0.6 - 3.4    POC LYMPH PERCENT 45.9  10 - 50 (%L)   MID (cbc) 0.7  0 - 0.9    POC MID % 10.0  0 - 12 (%M)   POC Granulocyte 3.3  2 - 6.9    Granulocyte percent 44.1  37 - 80 (%G)   RBC 4.20  4.04 - 5.48 (M/uL)   Hemoglobin 13.3  12.2 - 16.2 (g/dL)   HCT, POC 82.9  56.2 - 47.9 (%)   MCV 99.4 (*) 80 - 97 (fL)   MCH, POC 31.7 (*) 27 - 31.2 (pg)   MCHC 31.9  31.8 - 35.4 (g/dL)   RDW, POC 13.0     Platelet Count, POC 290  142 - 424 (K/uL)   MPV 9.9  0 - 99.8 (fL)  POCT GLYCOSYLATED HEMOGLOBIN  (HGB A1C)      Component Value Range   Hemoglobin A1C 6.7     Assessment: Pulsatile tinnitus Diarrhea Diabetes good control Protein diet and tolerance Obesity Chronic pain syndrome  Continue on same regular meds. She's not anemic.  I am going and checking blood chemistries she's on her. Discussed with a reasonable weight loss versus supplement diet plans. Spent a long time with her.

## 2011-06-24 LAB — COMPREHENSIVE METABOLIC PANEL
ALT: 19 U/L (ref 0–35)
BUN: 28 mg/dL — ABNORMAL HIGH (ref 6–23)
CO2: 24 mEq/L (ref 19–32)
Creat: 1.24 mg/dL — ABNORMAL HIGH (ref 0.50–1.10)
Glucose, Bld: 181 mg/dL — ABNORMAL HIGH (ref 70–99)
Total Bilirubin: 0.3 mg/dL (ref 0.3–1.2)

## 2011-06-25 ENCOUNTER — Telehealth: Payer: Self-pay

## 2011-06-25 NOTE — Progress Notes (Signed)
Gave patient lab results

## 2011-07-07 ENCOUNTER — Other Ambulatory Visit: Payer: Self-pay | Admitting: Family Medicine

## 2011-07-16 ENCOUNTER — Encounter: Payer: 59 | Admitting: Physician Assistant

## 2011-07-18 ENCOUNTER — Other Ambulatory Visit: Payer: Self-pay | Admitting: Physician Assistant

## 2011-07-20 ENCOUNTER — Ambulatory Visit: Payer: 59 | Admitting: Family Medicine

## 2011-07-20 NOTE — Progress Notes (Signed)
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  Patient left without being seen

## 2011-07-21 ENCOUNTER — Other Ambulatory Visit: Payer: Self-pay | Admitting: Family Medicine

## 2011-07-21 ENCOUNTER — Ambulatory Visit (INDEPENDENT_AMBULATORY_CARE_PROVIDER_SITE_OTHER): Payer: 59 | Admitting: Family Medicine

## 2011-07-21 VITALS — BP 120/68 | HR 83 | Temp 97.6°F | Resp 16 | Ht 62.5 in | Wt 221.4 lb

## 2011-07-21 DIAGNOSIS — E119 Type 2 diabetes mellitus without complications: Secondary | ICD-10-CM

## 2011-07-21 DIAGNOSIS — Z0000 Encounter for general adult medical examination without abnormal findings: Secondary | ICD-10-CM

## 2011-07-21 DIAGNOSIS — F411 Generalized anxiety disorder: Secondary | ICD-10-CM

## 2011-07-21 DIAGNOSIS — M549 Dorsalgia, unspecified: Secondary | ICD-10-CM

## 2011-07-21 DIAGNOSIS — M1990 Unspecified osteoarthritis, unspecified site: Secondary | ICD-10-CM

## 2011-07-21 DIAGNOSIS — E785 Hyperlipidemia, unspecified: Secondary | ICD-10-CM

## 2011-07-21 DIAGNOSIS — F419 Anxiety disorder, unspecified: Secondary | ICD-10-CM

## 2011-07-21 DIAGNOSIS — M129 Arthropathy, unspecified: Secondary | ICD-10-CM

## 2011-07-21 DIAGNOSIS — I10 Essential (primary) hypertension: Secondary | ICD-10-CM

## 2011-07-21 LAB — COMPREHENSIVE METABOLIC PANEL
ALT: 17 U/L (ref 0–35)
AST: 18 U/L (ref 0–37)
Albumin: 4.6 g/dL (ref 3.5–5.2)
Alkaline Phosphatase: 92 U/L (ref 39–117)
BUN: 30 mg/dL — ABNORMAL HIGH (ref 6–23)
CO2: 26 mEq/L (ref 19–32)
Calcium: 10.2 mg/dL (ref 8.4–10.5)
Chloride: 103 mEq/L (ref 96–112)
Creat: 1.2 mg/dL — ABNORMAL HIGH (ref 0.50–1.10)
Glucose, Bld: 105 mg/dL — ABNORMAL HIGH (ref 70–99)
Potassium: 3.9 mEq/L (ref 3.5–5.3)
Sodium: 137 mEq/L (ref 135–145)
Total Bilirubin: 0.3 mg/dL (ref 0.3–1.2)
Total Protein: 7.4 g/dL (ref 6.0–8.3)

## 2011-07-21 LAB — LIPID PANEL
Cholesterol: 155 mg/dL (ref 0–200)
HDL: 60 mg/dL (ref >39)
LDL Cholesterol: 78 mg/dL (ref 0–99)
Total CHOL/HDL Ratio: 2.6 Ratio
Triglycerides: 84 mg/dL (ref <150)
VLDL: 17 mg/dL (ref 0–40)

## 2011-07-21 LAB — HEPATITIS B SURFACE ANTIBODY, QUANTITATIVE: Hepatitis B-Post: 59.5 m[IU]/mL

## 2011-07-21 LAB — RUBELLA SCREEN: Rubella: >500 IU/mL — ABNORMAL HIGH

## 2011-07-21 LAB — MICROALBUMIN, URINE: Microalb, Ur: 0.5 mg/dL (ref 0.00–1.89)

## 2011-07-21 MED ORDER — PAROXETINE HCL 20 MG PO TABS: 20.0000 mg | ORAL_TABLET | ORAL | Status: DC

## 2011-07-21 MED ORDER — MELOXICAM 15 MG PO TABS: 15.0000 mg | ORAL_TABLET | Freq: Every day | ORAL | Status: DC

## 2011-07-21 MED ORDER — ALPRAZOLAM 0.5 MG PO TABS: 0.5000 mg | ORAL_TABLET | Freq: Three times a day (TID) | ORAL | Status: AC | PRN

## 2011-07-21 MED ORDER — LISINOPRIL-HYDROCHLOROTHIAZIDE 20-25 MG PO TABS: 1.0000 | ORAL_TABLET | Freq: Every day | ORAL | Status: DC

## 2011-07-21 MED ORDER — METFORMIN HCL 1000 MG PO TABS: 1000.0000 mg | ORAL_TABLET | Freq: Two times a day (BID) | ORAL | Status: DC

## 2011-07-21 MED ORDER — SITAGLIPTIN PHOSPHATE 50 MG PO TABS: 50.0000 mg | ORAL_TABLET | Freq: Every day | ORAL | Status: DC

## 2011-07-21 MED ORDER — PRAVASTATIN SODIUM 40 MG PO TABS: 40.0000 mg | ORAL_TABLET | Freq: Every day | ORAL | Status: DC

## 2011-07-21 MED ORDER — HYDROCODONE-ACETAMINOPHEN 7.5-750 MG PO TABS: 1.0000 | ORAL_TABLET | Freq: Three times a day (TID) | ORAL | Status: DC | PRN

## 2011-07-21 NOTE — Telephone Encounter (Signed)
Dr. Milus Glazier refilled 8  meds during 07/21/11 visit and I faxed the Rx's to [phone]. Confirmation came through at 1:29pm. Eileen Stanford

## 2011-07-21 NOTE — Progress Notes (Signed)
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  She is exercising.  She has lost 19 lbs on a new diet:  Low carb, high veg, high  protein  She also has chronic back pain.  Meloxicam seems to help.  Objective:  NAD  @UMFCLOGO @  Patient ID: Madeline Guerrero MRN: [id number], DOB: May 21, 1950, 61 y.o. Date of Encounter: 07/21/2011, 12:46 PM  Primary Physician: Ardeen Garland, MD  Chief Complaint: Diabetes follow up  HPI: 61 y.o. year old female with history below presents for follow up of diabetes mellitus. Doing well. Taking medications daily without adverse effects. No polydipsia, polyphagia, polyuria, or nocturia.  Staying on Januvia   She is exercising.  She has lost 19 lbs on a new diet:  Low carb, high veg, high  protein  She also has chronic back pain.  Meloxicam seems to help.  Blood sugars at home:  Generally running around 140 Diet consists of: see above Exercising regularly. Last A1C:  6.7 last month  Eye MD:  Due now DDS:  Due now   Past Medical History  Diagnosis Date  . Diabetes mellitus   . Hypertension      Home Meds: Prior to Admission medications   Medication Sig Start Date End Date Taking? Authorizing Provider  ALPRAZolam Prudy Feeler) 0.5 MG tablet Use one daily only when needed for anxiety 06/23/11  Yes Peyton Najjar, MD  Hydrocodone-Acetaminophen (VICODIN ES) 7.5-300 MG TABS One daily for severe pain only 06/23/11  Yes Peyton Najjar, MD  lisinopril-hydrochlorothiazide (PRINZIDE,ZESTORETIC) 20-25 MG per tablet Take 1 tablet by mouth daily. 06/23/11  Yes Peyton Najjar, MD  meloxicam (MOBIC) 15 MG tablet Take 1 tablet (15 mg total) by mouth daily. NEEDS OFFICE VISIT 06/23/11  Yes Peyton Najjar, MD  metFORMIN (GLUCOPHAGE) 1000 MG tablet TAKE 1 TABLET TWICE DAILY WITH MEALS 07/18/11  Yes Ryan M Dunn, PA-C  PARoxetine (PAXIL) 20 MG tablet Take 1 tablet (20 mg total) by mouth 1 day or 1 dose. 06/23/11  Yes Peyton Najjar, MD  pravastatin  (PRAVACHOL) 40 MG tablet Take 1 tablet (40 mg total) by mouth daily. 07/07/11  Yes Chelle S Jeffery, PA-C  sitaGLIPtin (JANUVIA) 50 MG tablet Take 50 mg by mouth daily.   Yes Historical Provider, MD  butalbital-acetaminophen-caffeine (FIORICET, ESGIC) 50-325-40 MG per tablet Take 1 tablet by mouth 2 (two) times daily as needed. For migraines 03/08/11   Elvina Sidle, MD  meclizine (ANTIVERT) 25 MG tablet One every 6-8 hrs if needed for dizziness 06/23/11   Peyton Najjar, MD  saxagliptin HCl (ONGLYZA) 5 MG TABS tablet Take 1 tablet (5 mg total) by mouth daily. 04/05/11   Elvina Sidle, MD    Allergies:  Allergies  Allergen Reactions  . Saxagliptin Swelling    History   Social History  . Marital Status: Married    Spouse Name: N/A    Number of Children: N/A  . Years of Education: N/A   Occupational History  . Not on file.   Social History Main Topics  . Smoking status: Current Everyday Smoker -- 0.5 packs/day for 10 years    Types: Cigarettes  . Smokeless tobacco: Not on file  . Alcohol Use: No  . Drug Use: No  . Sexually Active: Not on file   Other Topics Concern  . Not on file   Social History Narrative  . No narrative on file     Review of Systems: Constitutional: negative for chills, fever, night  sweats, weight changes, or fatigue  HEENT: negative for vision changes, hearing loss, congestion, rhinorrhea, or epistaxis Cardiovascular: negative for chest pain, palpitations, diaphoresis, DOE, orthopnea, or edema Respiratory: negative for hemoptysis, wheezing, shortness of breath, dyspnea, or cough Abdominal: negative for abdominal pain, nausea, vomiting, diarrhea, or constipation Dermatological: negative for rash, erythema, or wounds Neurologic: negative for headache, dizziness, or syncope Renal:  Negative for polyuria, polydipsia, or dysuria All other systems reviewed and are otherwise negative with the exception to those above and in the HPI.   Physical Exam: Blood  pressure 120/68, pulse 83, temperature 97.6 F (36.4 C), temperature source Oral, resp. rate 16, height 5' 2.5" (1.588 m), weight 221 lb 6.4 oz (100.426 kg), SpO2 100.00%., Body mass index is 39.85 kg/(m^2). General: Well developed, well nourished, in no acute distress. Head: Normocephalic, atraumatic, eyes without discharge, sclera non-icteric, nares are without discharge. Bilateral auditory canals clear, TM's are without perforation, pearly grey and translucent with reflective cone of light bilaterally. Oral cavity moist, posterior pharynx without exudate, erythema, peritonsillar abscess, or post nasal drip.  Neck: Supple. No thyromegaly. Full ROM. No lymphadenopathy. Lungs: Clear bilaterally to auscultation without wheezes, rales, or rhonchi. Breathing is unlabored. Heart: RRR with S1 S2. No murmurs, rubs, or gallops appreciated. Abdomen: Soft, non-tender, non-distended with normoactive bowel sounds. No hepatosplenomegaly. No rebound/guarding. No obvious abdominal masses. Msk:  Strength and tone normal for age. Extremities/Skin: Warm and dry. No clubbing or cyanosis. No edema. No rashes, wounds, or suspicious lesions. Monofilament exam unremarkable bilaterally.  Neuro: Alert and oriented X 3. Moves all extremities spontaneously. Gait is normal. CNII-XII grossly in tact. Psych:  Responds to questions appropriately with a normal affect.     ASSESSMENT AND PLAN:  61 y.o. year old female with controlled diabetes 1. Anxiety  PARoxetine (PAXIL) 20 MG tablet, ALPRAZolam (XANAX) 0.5 MG tablet  2. Back pain  meloxicam (MOBIC) 15 MG tablet, HYDROcodone-acetaminophen (VICODIN ES) 7.5-750 MG per tablet  3. HTN (hypertension)  lisinopril-hydrochlorothiazide (PRINZIDE,ZESTORETIC) 20-25 MG per tablet, Comprehensive metabolic panel  4. DM (diabetes mellitus)  PARoxetine (PAXIL) 20 MG tablet, metFORMIN (GLUCOPHAGE) 1000 MG tablet, sitaGLIPtin (JANUVIA) 50 MG tablet, Comprehensive metabolic panel, Microalbumin,  urine  5. Arthritis  meloxicam (MOBIC) 15 MG tablet, HYDROcodone-acetaminophen (VICODIN ES) 7.5-750 MG per tablet  6. Hyperlipidemia  pravastatin (PRAVACHOL) 40 MG tablet, Lipid panel    -  Signed, Elvina Sidle, MD 07/21/2011 12:46 PM

## 2011-07-22 LAB — RUBEOLA ANTIBODY IGG: Rubeola IgG: 4.97 {ISR} — ABNORMAL HIGH

## 2011-07-22 LAB — MUMPS ANTIBODY, IGG: Mumps IgG: 2.6 {ISR} — ABNORMAL HIGH

## 2011-09-01 ENCOUNTER — Telehealth: Payer: Self-pay | Admitting: *Deleted

## 2011-09-01 ENCOUNTER — Ambulatory Visit (INDEPENDENT_AMBULATORY_CARE_PROVIDER_SITE_OTHER): Payer: 59 | Admitting: Internal Medicine

## 2011-09-01 VITALS — BP 154/90 | HR 82 | Temp 98.2°F | Resp 16 | Ht 62.5 in | Wt 217.0 lb

## 2011-09-01 DIAGNOSIS — R05 Cough: Secondary | ICD-10-CM

## 2011-09-01 DIAGNOSIS — J329 Chronic sinusitis, unspecified: Secondary | ICD-10-CM

## 2011-09-01 MED ORDER — HYDROCOD POLST-CHLORPHEN POLST 10-8 MG/5ML PO LQCR: ORAL | Status: DC

## 2011-09-01 MED ORDER — AZITHROMYCIN 500 MG PO TABS: 500.0000 mg | ORAL_TABLET | Freq: Every day | ORAL | Status: DC

## 2011-09-01 MED ORDER — HYDROCODONE-ACETAMINOPHEN 7.5-500 MG/15ML PO SOLN: 5.0000 mL | Freq: Four times a day (QID) | ORAL | Status: DC | PRN

## 2011-09-01 NOTE — Progress Notes (Signed)
  Subjective:    Patient ID: Madeline Guerrero, female    DOB: 1950/01/23, 61 y.o.   MRN: [id number]  HPI Has cough/uri sxs Sputum white, occ sob, does smoke Had abscess in mouth, drained   Review of Systems Niddm/htn    Objective:   Physical Exam  Constitutional: She is oriented to person, place, and time. She appears well-nourished. No distress.  HENT:  Right Ear: External ear normal.  Left Ear: External ear normal.  Nose: Mucosal edema, rhinorrhea and sinus tenderness present. Right sinus exhibits maxillary sinus tenderness. Left sinus exhibits maxillary sinus tenderness.  Mouth/Throat: Oropharynx is clear and moist.  Neck: Neck supple.  Cardiovascular: Normal rate, regular rhythm and normal heart sounds.   Pulmonary/Chest: Effort normal and breath sounds normal.  Lymphadenopathy:    She has no cervical adenopathy.  Neurological: She is alert and oriented to person, place, and time.  Psychiatric: She has a normal mood and affect.          Assessment & Plan:  Sinusitis/cough

## 2011-09-01 NOTE — Telephone Encounter (Signed)
Pt stated she would rather have Tussionex it works better for her.    Tussionex called in

## 2011-10-03 ENCOUNTER — Encounter: Payer: Self-pay | Admitting: Gastroenterology

## 2011-11-07 NOTE — Progress Notes (Signed)
This encounter was created in error - please disregard.

## 2012-02-10 ENCOUNTER — Ambulatory Visit (INDEPENDENT_AMBULATORY_CARE_PROVIDER_SITE_OTHER): Payer: 59 | Admitting: Family Medicine

## 2012-02-10 VITALS — BP 141/83 | HR 83 | Temp 99.0°F | Resp 16 | Ht 64.0 in | Wt 213.0 lb

## 2012-02-10 DIAGNOSIS — M549 Dorsalgia, unspecified: Secondary | ICD-10-CM

## 2012-02-10 DIAGNOSIS — I10 Essential (primary) hypertension: Secondary | ICD-10-CM

## 2012-02-10 DIAGNOSIS — F419 Anxiety disorder, unspecified: Secondary | ICD-10-CM

## 2012-02-10 DIAGNOSIS — E785 Hyperlipidemia, unspecified: Secondary | ICD-10-CM

## 2012-02-10 DIAGNOSIS — M129 Arthropathy, unspecified: Secondary | ICD-10-CM

## 2012-02-10 DIAGNOSIS — E119 Type 2 diabetes mellitus without complications: Secondary | ICD-10-CM

## 2012-02-10 DIAGNOSIS — M1990 Unspecified osteoarthritis, unspecified site: Secondary | ICD-10-CM

## 2012-02-10 DIAGNOSIS — F411 Generalized anxiety disorder: Secondary | ICD-10-CM

## 2012-02-10 LAB — POCT GLYCOSYLATED HEMOGLOBIN (HGB A1C): Hemoglobin A1C: 7

## 2012-02-10 MED ORDER — SITAGLIPTIN PHOSPHATE 50 MG PO TABS: 50.0000 mg | ORAL_TABLET | Freq: Every day | ORAL | Status: DC

## 2012-02-10 MED ORDER — ALPRAZOLAM 0.5 MG PO TABS: 0.5000 mg | ORAL_TABLET | Freq: Three times a day (TID) | ORAL | Status: DC

## 2012-02-10 MED ORDER — MELOXICAM 15 MG PO TABS: 15.0000 mg | ORAL_TABLET | Freq: Every day | ORAL | Status: DC

## 2012-02-10 MED ORDER — PRAVASTATIN SODIUM 40 MG PO TABS: 40.0000 mg | ORAL_TABLET | Freq: Every day | ORAL | Status: DC

## 2012-02-10 MED ORDER — METFORMIN HCL 1000 MG PO TABS: 1000.0000 mg | ORAL_TABLET | Freq: Two times a day (BID) | ORAL | Status: DC

## 2012-02-10 MED ORDER — LISINOPRIL-HYDROCHLOROTHIAZIDE 20-25 MG PO TABS: 1.0000 | ORAL_TABLET | Freq: Every day | ORAL | Status: DC

## 2012-02-10 MED ORDER — HYDROCODONE-ACETAMINOPHEN 7.5-500 MG PO TABS: 1.0000 | ORAL_TABLET | Freq: Three times a day (TID) | ORAL | Status: DC | PRN

## 2012-02-10 MED ORDER — PAROXETINE HCL 20 MG PO TABS: 20.0000 mg | ORAL_TABLET | ORAL | Status: DC

## 2012-02-10 MED ORDER — LISINOPRIL-HYDROCHLOROTHIAZIDE 20-25 MG PO TABS
1.0000 | ORAL_TABLET | Freq: Every day | ORAL | Status: DC
Start: 2012-02-10 — End: 2012-07-12

## 2012-02-10 NOTE — Progress Notes (Signed)
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 Rx lately:  Vicodin, meloxicam.  Diabetes lately has been poorly controlled 160-170 Almost out of all meds. Diabetes x 15 years No numbness  Objective: NAD; overweight. 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 1. HTN (hypertension)  lisinopril-hydrochlorothiazide (PRINZIDE,ZESTORETIC) 20-25 MG per tablet, Microalbumin, urine, Lipid panel, POCT glycosylated hemoglobin (Hb A1C), Comprehensive metabolic panel  2. Arthritis  meloxicam (MOBIC) 15 MG tablet, HYDROcodone-acetaminophen (LORTAB) 7.5-500 MG per tablet, Ambulatory referral to Pain Clinic  3. Back pain  meloxicam (MOBIC) 15 MG tablet, HYDROcodone-acetaminophen (LORTAB) 7.5-500 MG per tablet, Ambulatory referral to Pain Clinic  4. DM (diabetes mellitus)  metFORMIN (GLUCOPHAGE) 1000 MG tablet, PARoxetine (PAXIL) 20 MG tablet, Microalbumin, urine, Lipid panel, POCT glycosylated hemoglobin (Hb A1C), Comprehensive metabolic panel  5. Anxiety  PARoxetine (PAXIL) 20 MG tablet, ALPRAZolam (XANAX) 0.5 MG tablet  6. Hyperlipidemia  pravastatin (PRAVACHOL) 40 MG tablet, Lipid panel   Patient to get eye checked

## 2012-02-11 LAB — COMPREHENSIVE METABOLIC PANEL
ALT: 16 U/L (ref 0–35)
AST: 17 U/L (ref 0–37)
Albumin: 4.1 g/dL (ref 3.5–5.2)
Alkaline Phosphatase: 90 U/L (ref 39–117)
BUN: 24 mg/dL — ABNORMAL HIGH (ref 6–23)
CO2: 28 mEq/L (ref 19–32)
Calcium: 9.6 mg/dL (ref 8.4–10.5)
Chloride: 108 mEq/L (ref 96–112)
Creat: 0.88 mg/dL (ref 0.50–1.10)
Glucose, Bld: 147 mg/dL — ABNORMAL HIGH (ref 70–99)
Potassium: 3.8 mEq/L (ref 3.5–5.3)
Sodium: 145 mEq/L (ref 135–145)
Total Bilirubin: 0.2 mg/dL — ABNORMAL LOW (ref 0.3–1.2)
Total Protein: 6.7 g/dL (ref 6.0–8.3)

## 2012-02-11 LAB — LIPID PANEL
Cholesterol: 206 mg/dL — ABNORMAL HIGH (ref 0–200)
HDL: 63 mg/dL (ref >39)
LDL Cholesterol: 123 mg/dL — ABNORMAL HIGH (ref 0–99)
Total CHOL/HDL Ratio: 3.3 Ratio
Triglycerides: 101 mg/dL (ref <150)
VLDL: 20 mg/dL (ref 0–40)

## 2012-02-12 LAB — MICROALBUMIN, URINE: Microalb, Ur: 0.71 mg/dL (ref 0.00–1.89)

## 2012-02-16 ENCOUNTER — Telehealth: Payer: Self-pay

## 2012-02-16 NOTE — Telephone Encounter (Signed)
PATIENT STATES SHE WAS IN THE OFFICE TO SEE DR. Milus Glazier FOR A SINUS INFECTION ON Monday. SHE THOUGHT HE WAS GOING TO SEND ALL HER MEDICATION REFILLS TO [facility]. SHE SAID BY NOW, [facility] WOULD HAVE CALLED HER TO LET HER KNOW HOW MUCH THE MEDICATION WOULD BE AND SHE PAYS THEM BY PHONE. SHE SAID SHE HAS NOT HEARD ANYTHING AND WANTS TO KNOW IF IT HAS BEEN DONE? SHE NEEDS TO GET 3 MONTH REFILLS, BECAUSE IF SHE DOES IT EVERY MONTH THROUGH [facility] IT IS $120.00. IF SHE GETS 3 MONTHS, IT IS $180.00. BEST  PHONE [phone] (CELL)    PHARMACY CHOICE IS [facility].   MBC

## 2012-02-17 ENCOUNTER — Other Ambulatory Visit: Payer: Self-pay

## 2012-02-17 DIAGNOSIS — E785 Hyperlipidemia, unspecified: Secondary | ICD-10-CM

## 2012-02-17 DIAGNOSIS — M549 Dorsalgia, unspecified: Secondary | ICD-10-CM

## 2012-02-17 DIAGNOSIS — F419 Anxiety disorder, unspecified: Secondary | ICD-10-CM

## 2012-02-17 DIAGNOSIS — E119 Type 2 diabetes mellitus without complications: Secondary | ICD-10-CM

## 2012-02-17 DIAGNOSIS — M1990 Unspecified osteoarthritis, unspecified site: Secondary | ICD-10-CM

## 2012-02-17 MED ORDER — METFORMIN HCL 1000 MG PO TABS: 1000.0000 mg | ORAL_TABLET | Freq: Two times a day (BID) | ORAL | Status: DC

## 2012-02-17 MED ORDER — PAROXETINE HCL 20 MG PO TABS: 20.0000 mg | ORAL_TABLET | ORAL | Status: DC

## 2012-02-17 MED ORDER — PRAVASTATIN SODIUM 40 MG PO TABS: 40.0000 mg | ORAL_TABLET | Freq: Every day | ORAL | Status: DC

## 2012-02-17 MED ORDER — HYDROCODONE-ACETAMINOPHEN 7.5-500 MG PO TABS: 1.0000 | ORAL_TABLET | Freq: Three times a day (TID) | ORAL | Status: DC | PRN

## 2012-02-17 MED ORDER — ALPRAZOLAM 0.5 MG PO TABS: 0.5000 mg | ORAL_TABLET | Freq: Three times a day (TID) | ORAL | Status: DC

## 2012-02-17 MED ORDER — SITAGLIPTIN PHOSPHATE 50 MG PO TABS: 50.0000 mg | ORAL_TABLET | Freq: Every day | ORAL | Status: DC

## 2012-02-17 MED ORDER — MELOXICAM 15 MG PO TABS: 15.0000 mg | ORAL_TABLET | Freq: Every day | ORAL | Status: DC

## 2012-02-17 NOTE — Telephone Encounter (Signed)
Spoke with pt, advised Dr Milus Glazier did send in her Rxs for 90 days. Advised pt to call [facility] to make sure they received.

## 2012-02-19 ENCOUNTER — Telehealth: Payer: Self-pay

## 2012-02-19 NOTE — Telephone Encounter (Signed)
She called back and wanted rx's sent to [facility].  rx's faxed

## 2012-02-19 NOTE — Telephone Encounter (Signed)
Pt states that she is returning tamara's call regarding number to pharmacist -she states it is the same as on file but it is not [facility] [phone] is what we need to call   Best number [phone]

## 2012-02-24 ENCOUNTER — Telehealth: Payer: Self-pay

## 2012-02-24 NOTE — Telephone Encounter (Signed)
Pt would like to talk to someone about her prescriptions if possible [phone]

## 2012-02-25 NOTE — Telephone Encounter (Signed)
Patient states the hydrocodone needs to be [facility] for [facility] dose. She takes tid, the dose needs to be 7.5/325, is this okay to resubmit? Needs to be printed and faxed.

## 2012-02-25 NOTE — Telephone Encounter (Signed)
Her Rx were sent to the mail order, but the mail order company indicates they do not have this patient on file. I have left message for her, to call me back. This may have to do with her hyphenated name, have had trouble with hyphenated names at mail order in the past.

## 2012-02-26 NOTE — Telephone Encounter (Signed)
1. Verify that we sent the Rx to the correct mail-order pharmacy.  Previous phone messages regarding this indicate different pharmacies each time.  If we can verify that the pharmacy that we sent it to has not filled it, then yes, OK to resend per Dr. Loma Boston original order.

## 2012-02-28 NOTE — Telephone Encounter (Signed)
Patient call back stating she already gave [facility] the necessary info and she already stated that to [place]. States the pain clinic was closed due to the weather and they did not tell her. She also says whoever faxed her Rx put a line thru it and that's why they need another written rx for the Vicodin. It was written for 7.5/500 which is not made anymore and it needs to be 7.5/325. She is upset and states she is being mistreated because Dr. Milus Glazier is not here.  Please call back [phone].

## 2012-02-28 NOTE — Telephone Encounter (Signed)
Called [facility] (see prev phone message w/request) and was advised that they have not received a Rx from [place], but they do show a request from the pt for them to contact us for RF. The Rx would have to be changed to hydrocodone 7.5-325 mg, no longer have 7.5-500 available.  Called [facility] to check if Rx was filled there and was told that pt does not have an active acct with them.  LMOM for pt to CB to verify Rx needs to be sent to [facility] and not to [facility].

## 2012-03-02 ENCOUNTER — Other Ambulatory Visit: Payer: Self-pay | Admitting: *Deleted

## 2012-03-02 ENCOUNTER — Telehealth: Payer: Self-pay | Admitting: *Deleted

## 2012-03-02 DIAGNOSIS — M1990 Unspecified osteoarthritis, unspecified site: Secondary | ICD-10-CM

## 2012-03-02 DIAGNOSIS — F419 Anxiety disorder, unspecified: Secondary | ICD-10-CM

## 2012-03-02 DIAGNOSIS — M549 Dorsalgia, unspecified: Secondary | ICD-10-CM

## 2012-03-02 DIAGNOSIS — E119 Type 2 diabetes mellitus without complications: Secondary | ICD-10-CM

## 2012-03-02 MED ORDER — PAROXETINE HCL 20 MG PO TABS: 20.0000 mg | ORAL_TABLET | Freq: Every day | ORAL | Status: DC

## 2012-03-02 NOTE — Telephone Encounter (Signed)
Has this been done?

## 2012-03-02 NOTE — Telephone Encounter (Signed)
[facility] needs new rx for hydrocodone.  Dr Milus Glazier wrote for 7.5/500.  Can we change to 7.5/325? 90 day supply?

## 2012-03-06 MED ORDER — HYDROCODONE-ACETAMINOPHEN 7.5-500 MG PO TABS: 1.0000 | ORAL_TABLET | Freq: Three times a day (TID) | ORAL | Status: DC | PRN

## 2012-03-06 NOTE — Telephone Encounter (Signed)
I'm doing his prescriptions while he is out of the office.  I didn't change the dose and I will give 1 month supply to get her through until she can discuss potential dose change with Dr. Elbert Ewings.

## 2012-03-07 ENCOUNTER — Telehealth: Payer: Self-pay | Admitting: Physician Assistant

## 2012-03-07 DIAGNOSIS — M549 Dorsalgia, unspecified: Secondary | ICD-10-CM

## 2012-03-07 MED ORDER — HYDROCODONE-ACETAMINOPHEN 7.5-325 MG PO TABS: 1.0000 | ORAL_TABLET | Freq: Three times a day (TID) | ORAL | Status: DC | PRN

## 2012-03-07 NOTE — Telephone Encounter (Signed)
Pt was written for Vicodin 7.5/500 but that is no long made so I rewrote patient for Norco 7.5/325.

## 2012-03-08 NOTE — Telephone Encounter (Signed)
rx faxed in for 7.5/325

## 2012-07-12 ENCOUNTER — Other Ambulatory Visit: Payer: Self-pay | Admitting: Family Medicine

## 2012-07-29 ENCOUNTER — Ambulatory Visit (INDEPENDENT_AMBULATORY_CARE_PROVIDER_SITE_OTHER): Payer: 59 | Admitting: Family Medicine

## 2012-07-29 VITALS — BP 128/80 | HR 86 | Temp 98.0°F | Resp 16 | Ht 62.0 in | Wt 224.0 lb

## 2012-07-29 DIAGNOSIS — M549 Dorsalgia, unspecified: Secondary | ICD-10-CM

## 2012-07-29 DIAGNOSIS — M1990 Unspecified osteoarthritis, unspecified site: Secondary | ICD-10-CM

## 2012-07-29 DIAGNOSIS — I10 Essential (primary) hypertension: Secondary | ICD-10-CM

## 2012-07-29 DIAGNOSIS — E119 Type 2 diabetes mellitus without complications: Secondary | ICD-10-CM

## 2012-07-29 DIAGNOSIS — E785 Hyperlipidemia, unspecified: Secondary | ICD-10-CM

## 2012-07-29 DIAGNOSIS — F419 Anxiety disorder, unspecified: Secondary | ICD-10-CM

## 2012-07-30 ENCOUNTER — Telehealth: Payer: Self-pay

## 2012-07-30 ENCOUNTER — Encounter: Payer: Self-pay | Admitting: Family Medicine

## 2012-07-30 ENCOUNTER — Telehealth: Payer: Self-pay | Admitting: *Deleted

## 2012-07-30 MED ORDER — PRAVASTATIN SODIUM 40 MG PO TABS: 40.0000 mg | ORAL_TABLET | Freq: Every day | ORAL | Status: DC

## 2012-07-30 MED ORDER — SITAGLIPTIN PHOSPHATE 50 MG PO TABS: 50.0000 mg | ORAL_TABLET | Freq: Every day | ORAL | Status: DC

## 2012-07-30 MED ORDER — MELOXICAM 15 MG PO TABS: 15.0000 mg | ORAL_TABLET | Freq: Every day | ORAL | Status: DC

## 2012-07-30 MED ORDER — ALPRAZOLAM 0.5 MG PO TABS: 0.5000 mg | ORAL_TABLET | Freq: Three times a day (TID) | ORAL | Status: DC

## 2012-07-30 MED ORDER — METFORMIN HCL 1000 MG PO TABS: 1000.0000 mg | ORAL_TABLET | Freq: Two times a day (BID) | ORAL | Status: DC

## 2012-07-30 MED ORDER — LISINOPRIL-HYDROCHLOROTHIAZIDE 20-25 MG PO TABS: 1.0000 | ORAL_TABLET | Freq: Every day | ORAL | Status: DC

## 2012-07-30 MED ORDER — PAROXETINE HCL 20 MG PO TABS: 20.0000 mg | ORAL_TABLET | Freq: Every day | ORAL | Status: DC

## 2012-07-30 NOTE — Telephone Encounter (Signed)
See note below. Pt stated that if we call her back after she goes to work, we can leave a detailed message at [phone].

## 2012-07-30 NOTE — Progress Notes (Signed)
This is a 44 woman who came in on 7/16 at about the time that our computers went down. 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 She stopped traveling and is working in [place] now. Her husband is also a patient here.  Patient needs refills on her medications. She has chronic back pain and I have referred her to a pain clinic. Nevertheless the pain clinic consisted on treating her with oxycodone and Nucynta which made it difficult for her to work because of sedation. She's currently taking Naprosyn but finds that this is not sufficient to control her pain.  Her blood pressures have been well-controlled and her diabetes is well. She is wanting to make an appointment in August for complete evaluation.  Objective: No acute distress although patient moves slowly and stiffly in the exam room. She is obese, with good eye contact and is appropriate. 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 She will need a more thorough examination for her diabetes in terms of end organ involvement.  Plan:DM (diabetes mellitus) - Plan: sitaGLIPtin (JANUVIA) 50 MG tablet, PARoxetine (PAXIL) 20 MG tablet, metFORMIN (GLUCOPHAGE) 1000 MG tablet  Hyperlipidemia - Plan: pravastatin (PRAVACHOL) 40 MG tablet  Anxiety - Plan: PARoxetine (PAXIL) 20 MG tablet, ALPRAZolam (XANAX) 0.5 MG tablet  Arthritis - Plan: meloxicam (MOBIC) 15 MG tablet  Back pain - Plan: meloxicam (MOBIC) 15 MG tablet I also wrote  for Norco 5-325 #30 to be taken 1 daily prn, 3 RF.  Needs appointment in August.  Signed, Elvina Sidle, MD

## 2012-07-30 NOTE — Telephone Encounter (Signed)
Patient states that she does not want to continue going to the pain clinic and instead wants to be referred to [facility] (patient was not sure of the exact name). Patient was very upset and started crying as she described the level of pain that she is in and as described her medication to me. Please call patient back at [phone].

## 2012-07-30 NOTE — Telephone Encounter (Signed)
Called patient and left a message that Dr. Milus Glazier wrote her for 6 Rx's and patient has three different pharmacy's listed in epic. Would she call us and let us know which pharmacy she would like them faxed to or mailed. Rx's being held at TL desk at 104.

## 2012-07-30 NOTE — Telephone Encounter (Signed)
Pt Cb and asked for Rxs to be sent to [facility]. I called [facility] and gave them this info. Also, pt advised that she was given a Rx for hydrocodone yesterday but it was for the lower dose than she normally is Rxd and it does not work for her. She has not gotten this Rx filled yet and can bring it in to [place] if needed. Pt requested that a Rx for the 7.5-325 be sent to [facility], but she also needs some to a local pharmacy [facility] for the 15 days it takes [facility] to get the meds to her.

## 2012-07-30 NOTE — Telephone Encounter (Signed)
Faxed Rx's to [facility] also called and left a message to let patient know that Dr Milus Glazier stated he could not write her for a higher dose of the hydrocodone, that is why he told her to go to the pain clinic and discuss that with them. Let her know she could fill the Rx that he wrote her yesterday if she needed to until she goes to the pain clinic. Instructed patient to call back to the office if she had any more questions.

## 2012-07-30 NOTE — Telephone Encounter (Signed)
Mail order [facility] requests RF of hydrocodone.

## 2012-07-30 NOTE — Telephone Encounter (Signed)
[facility] will not take over her pain management, they do not have pain management physicians, is anything further needed with this?

## 2012-07-31 NOTE — Telephone Encounter (Signed)
Looks like patient has to choose between the pain medicine I prescribed, her pain clinic, or some other approach (like physical therapy).

## 2012-08-01 NOTE — Telephone Encounter (Signed)
Pt has seen Dr. Otelia Sergeant. She had surgery in the past that was only partially successful. She is going to call them and try to make an appt herself. She wants to get to the cause of the pain. Dr. Otelia Sergeant told her that she might need surgery again in the future. She does not want to continue working with the pain clinic. She is tired of taking the pain medication.

## 2012-08-04 NOTE — Progress Notes (Signed)
Left msg for pt to schedule August OV with Dr. Elbert Ewings.

## 2012-08-06 NOTE — Progress Notes (Signed)
Appt made for 8/7

## 2012-08-20 ENCOUNTER — Ambulatory Visit (INDEPENDENT_AMBULATORY_CARE_PROVIDER_SITE_OTHER): Payer: 59 | Admitting: Family Medicine

## 2012-08-20 ENCOUNTER — Encounter: Payer: Self-pay | Admitting: Family Medicine

## 2012-08-20 VITALS — BP 140/77 | HR 85 | Temp 98.2°F | Resp 18 | Ht 62.0 in | Wt 222.0 lb

## 2012-08-20 DIAGNOSIS — E119 Type 2 diabetes mellitus without complications: Secondary | ICD-10-CM

## 2012-08-20 LAB — CBC
HCT: 34.3 % — ABNORMAL LOW (ref 36.0–46.0)
Hemoglobin: 12.2 g/dL (ref 12.0–15.0)
MCH: 34.5 pg — ABNORMAL HIGH (ref 26.0–34.0)
MCHC: 35.6 g/dL (ref 30.0–36.0)
MCV: 96.9 fL (ref 78.0–100.0)
Platelets: 275 10*3/uL (ref 150–400)
RBC: 3.54 MIL/uL — ABNORMAL LOW (ref 3.87–5.11)
RDW: 14.1 % (ref 11.5–15.5)
WBC: 6.4 10*3/uL (ref 4.0–10.5)

## 2012-08-20 LAB — POCT GLYCOSYLATED HEMOGLOBIN (HGB A1C): Hemoglobin A1C: 6.9

## 2012-08-20 NOTE — Progress Notes (Signed)
Patient ID: Aries Kasa MRN: [id number], DOB: Mar 14, 1950, 62 y.o. Date of Encounter: 08/20/2012, 12:33 PM  Primary Physician: Elvina Sidle, MD  Chief Complaint: Diabetes follow up  HPI: 62 y.o. year old female with history below presents for follow up of diabetes mellitus. Doing well. No issues or complaints. Taking medications daily without adverse effects. No polydipsia, polyphagia, polyuria, or nocturia.  Blood sugars at home: <140 Diet consists of:  Vegetables, decreased carbs, more protein Exercising regularly. 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     Past Medical History  Diagnosis Date  . Diabetes mellitus   . Hypertension      Home Meds: Prior to Admission medications   Medication Sig Start Date End Date Taking? Authorizing Provider  ALPRAZolam Prudy Feeler) 0.5 MG tablet Take 1 tablet (0.5 mg total) by mouth 3 (three) times daily. 07/30/12  Yes Elvina Sidle, MD  HYDROcodone-acetaminophen (NORCO) 7.5-325 MG per tablet Take 1 tablet by mouth 3 (three) times daily as needed for pain. 03/07/12  Yes Morrell Riddle, PA-C  lisinopril-hydrochlorothiazide (PRINZIDE,ZESTORETIC) 20-25 MG per tablet Take 1 tablet by mouth daily. 07/30/12  Yes Elvina Sidle, MD  meloxicam (MOBIC) 15 MG tablet Take 1 tablet (15 mg total) by mouth daily. NEEDS OFFICE VISIT 07/30/12  Yes Elvina Sidle, MD  metFORMIN (GLUCOPHAGE) 1000 MG tablet Take 1 tablet (1,000 mg total) by mouth 2 (two) times daily with a meal. 07/30/12  Yes Elvina Sidle, MD  PARoxetine (PAXIL) 20 MG tablet Take 1 tablet (20 mg total) by mouth daily. 07/30/12  Yes Elvina Sidle, MD  pravastatin (PRAVACHOL) 40 MG tablet Take 1 tablet (40 mg total) by mouth daily. 07/30/12  Yes Elvina Sidle, MD  sitaGLIPtin (JANUVIA) 50 MG tablet Take 1 tablet (50 mg total) by mouth daily. 07/30/12 07/30/13 Yes Elvina Sidle,  MD    Allergies:  Allergies  Allergen Reactions  . Onglyza (Saxagliptin) Swelling  . Ultram (Tramadol) Nausea And Vomiting  . Levaquin (Levofloxacin) Rash    History   Social History  . Marital Status: Married    Spouse Name: N/A    Number of Children: N/A  . Years of Education: N/A   Occupational History  . Not on file.   Social History Main Topics  . Smoking status: Current Every Day Smoker -- 0.50 packs/day for 10 years    Types: Cigarettes  . Smokeless tobacco: Not on file  . Alcohol Use: No  . Drug Use: No  . Sexually Active: Not on file   Other Topics Concern  . Not on file   Social History Narrative  . No narrative on file     Review of Systems: Constitutional: negative for chills, fever, night sweats, weight changes, or fatigue  HEENT: negative for vision changes, hearing loss, congestion, rhinorrhea, or epistaxis Cardiovascular: negative for chest pain, palpitations, diaphoresis, DOE, orthopnea, or edema Respiratory: negative for hemoptysis, wheezing, shortness of breath, dyspnea, or cough Abdominal: negative for abdominal pain, nausea, vomiting, diarrhea, or constipation Dermatological: negative for rash, erythema, or wounds Neurologic: negative for headache, dizziness, or syncope Renal:  Negative for polyuria, polydipsia, or dysuria All other systems reviewed and are otherwise negative with the exception to those above and in the HPI.   Physical Exam: Blood pressure 140/77, pulse 85, temperature 98.2 F (36.8 C), resp. rate 18, height 5\' 2"  (1.575 m), weight 222 lb (100.699  kg)., Body mass index is 40.59 kg/(m^2). General: Well developed, well nourished, in no acute distress. Head: Normocephalic, atraumatic, eyes without discharge, sclera non-icteric, nares are without discharge. Bilateral auditory canals clear, TM's are without perforation, pearly grey and translucent with reflective cone of light bilaterally. Oral cavity moist, posterior pharynx  without exudate, erythema, peritonsillar abscess, or post nasal drip.  Neck: Supple. No thyromegaly. Full ROM. No lymphadenopathy. Lungs: Clear bilaterally to auscultation without wheezes, rales, or rhonchi. Breathing is unlabored. Heart: RRR with S1 S2. No murmurs, rubs, or gallops appreciated. Abdomen: Soft, non-tender, non-distended with normoactive bowel sounds. No hepatosplenomegaly. No rebound/guarding. No obvious abdominal masses. Msk:  Strength and tone normal for age. Extremities/Skin: Warm and dry. No clubbing or cyanosis. No edema. No rashes, wounds, or suspicious lesions. Monofilament exam  normal.  Neuro: Alert and oriented X 3. Moves all extremities spontaneously. Gait is normal. CNII-XII grossly in tact. Psych:  Responds to questions appropriately with a normal affect.   Labs:    ASSESSMENT AND PLAN:  62 y.o. year old female with reasonably controlled diabetes. Type 2 diabetes mellitus - Plan: POCT glycosylated hemoglobin (Hb A1C), Comprehensive metabolic panel, Lipid panel, CBC, CANCELED: Microalbumin, urine   -  Signed, Elvina Sidle, MD 08/20/2012 12:33 PM

## 2012-08-21 LAB — LIPID PANEL
Cholesterol: 152 mg/dL (ref 0–200)
HDL: 63 mg/dL (ref >39)
LDL Cholesterol: 75 mg/dL (ref 0–99)
Total CHOL/HDL Ratio: 2.4 Ratio
Triglycerides: 68 mg/dL (ref <150)
VLDL: 14 mg/dL (ref 0–40)

## 2012-08-21 LAB — COMPREHENSIVE METABOLIC PANEL
ALT: 19 U/L (ref 0–35)
AST: 28 U/L (ref 0–37)
Albumin: 4.1 g/dL (ref 3.5–5.2)
Alkaline Phosphatase: 82 U/L (ref 39–117)
BUN: 22 mg/dL (ref 6–23)
CO2: 27 mEq/L (ref 19–32)
Calcium: 9.9 mg/dL (ref 8.4–10.5)
Chloride: 102 mEq/L (ref 96–112)
Creat: 0.9 mg/dL (ref 0.50–1.10)
Glucose, Bld: 165 mg/dL — ABNORMAL HIGH (ref 70–99)
Potassium: 4 mEq/L (ref 3.5–5.3)
Sodium: 139 mEq/L (ref 135–145)
Total Bilirubin: 0.3 mg/dL (ref 0.3–1.2)
Total Protein: 6.7 g/dL (ref 6.0–8.3)

## 2012-08-26 ENCOUNTER — Telehealth: Payer: Self-pay

## 2012-08-26 NOTE — Telephone Encounter (Signed)
[facility] request refill on Hydrocodone. Please advise

## 2012-09-14 ENCOUNTER — Other Ambulatory Visit: Payer: Self-pay | Admitting: Physician Assistant

## 2012-11-01 ENCOUNTER — Ambulatory Visit (INDEPENDENT_AMBULATORY_CARE_PROVIDER_SITE_OTHER): Payer: 59 | Admitting: Family Medicine

## 2012-11-01 ENCOUNTER — Ambulatory Visit: Payer: 59

## 2012-11-01 VITALS — BP 136/70 | HR 79 | Temp 98.0°F | Resp 16 | Ht 64.0 in | Wt 232.0 lb

## 2012-11-01 DIAGNOSIS — E785 Hyperlipidemia, unspecified: Secondary | ICD-10-CM

## 2012-11-01 DIAGNOSIS — M25569 Pain in unspecified knee: Secondary | ICD-10-CM

## 2012-11-01 DIAGNOSIS — I10 Essential (primary) hypertension: Secondary | ICD-10-CM

## 2012-11-01 DIAGNOSIS — F419 Anxiety disorder, unspecified: Secondary | ICD-10-CM

## 2012-11-01 DIAGNOSIS — F411 Generalized anxiety disorder: Secondary | ICD-10-CM

## 2012-11-01 DIAGNOSIS — E119 Type 2 diabetes mellitus without complications: Secondary | ICD-10-CM

## 2012-11-01 DIAGNOSIS — M25562 Pain in left knee: Secondary | ICD-10-CM

## 2012-11-01 LAB — RHEUMATOID FACTOR: Rhuematoid fact SerPl-aCnc: <10 IU/mL (ref <=14)

## 2012-11-01 LAB — POCT SEDIMENTATION RATE: POCT SED RATE: 65 mm/hr — AB (ref 0–22)

## 2012-11-01 MED ORDER — LISINOPRIL-HYDROCHLOROTHIAZIDE 20-25 MG PO TABS: 1.0000 | ORAL_TABLET | Freq: Every day | ORAL | Status: DC

## 2012-11-01 MED ORDER — SITAGLIPTIN PHOSPHATE 50 MG PO TABS: 50.0000 mg | ORAL_TABLET | Freq: Every day | ORAL | Status: DC

## 2012-11-01 MED ORDER — HYDROCODONE-ACETAMINOPHEN 7.5-325 MG PO TABS: 1.0000 | ORAL_TABLET | Freq: Three times a day (TID) | ORAL | Status: DC | PRN

## 2012-11-01 MED ORDER — METFORMIN HCL 1000 MG PO TABS: 1000.0000 mg | ORAL_TABLET | Freq: Two times a day (BID) | ORAL | Status: DC

## 2012-11-01 MED ORDER — METHYLPREDNISOLONE ACETATE 80 MG/ML IJ SUSP
80.0000 mg | Freq: Once | INTRAMUSCULAR | Status: AC
  Administered 2012-11-01: 80 mg via INTRA_ARTICULAR

## 2012-11-01 MED ORDER — ALPRAZOLAM 0.5 MG PO TABS: 0.5000 mg | ORAL_TABLET | Freq: Three times a day (TID) | ORAL | Status: DC

## 2012-11-01 MED ORDER — PRAVASTATIN SODIUM 40 MG PO TABS: 40.0000 mg | ORAL_TABLET | Freq: Every day | ORAL | Status: DC

## 2012-11-01 NOTE — Progress Notes (Signed)
Subjective:  This chart was scribed for Elvina Sidle, MD by Carl Best, Medical Scribe. This patient was seen in Room 4 and the patient's care was started at 1:18 PM.     Patient ID: Madeline Guerrero, female    DOB: 05-10-1950, 62 y.o.   MRN: [id number]  HPI HPI Comments: Madeline Guerrero is a 62 y.o. female with a history of DM and hypertension who presents to the [facility] complaining of constant, aching pain and swelling to her left knee that started three weeks ago.  The patient states that she had surgery to her left knee in the past which caused a scrape in her meniscus.  The patient thinks that she has arthritis because her body aches.  She states that her hands are swollen and ache bilaterally.  She states that she has taken vicodin, ibuprofen, advil, and naprosyn for her symptoms but denies any relief to her pain.  The patient denies receiving a cortisol shot in the past.  The patient is a Engineer, civil (consulting) at [employer].  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    Past Medical History  Diagnosis Date  . Diabetes mellitus   . Hypertension    Past Surgical History  Procedure Laterality Date  . Cholecystectomy    . Carpal tunnel release    . Neck surgery     No family history on file.  History   Social History  . Marital Status: Married    Spouse Name: N/A    Number of Children: N/A  . Years of Education: N/A   Occupational History  . Not on file.   Social History Main Topics  . Smoking status: Current Every Day Smoker -- 0.50 packs/day for 10 years    Types: Cigarettes  . Smokeless tobacco: Not on file  . Alcohol Use: No  . Drug Use: No  . Sexual Activity: Not on file   Other Topics Concern  . Not on file   Social History Narrative  . No narrative on file    Allergies  Allergen Reactions  . Onglyza [Saxagliptin] Swelling  . Ultram [Tramadol] Nausea And Vomiting  . Levaquin [Levofloxacin] Rash   Current outpatient  prescriptions:ALPRAZolam (XANAX) 0.5 MG tablet, Take 1 tablet (0.5 mg total) by mouth 3 (three) times daily., Disp: 90 tablet, Rfl: 3;  HYDROcodone-acetaminophen (NORCO) 7.5-325 MG per tablet, Take 1 tablet by mouth 3 (three) times daily as needed for pain., Disp: 90 tablet, Rfl: 0;  lisinopril-hydrochlorothiazide (PRINZIDE,ZESTORETIC) 20-25 MG per tablet, Take 1 tablet by mouth  daily, Disp: 90 tablet, Rfl: 2 meloxicam (MOBIC) 15 MG tablet, Take 1 tablet (15 mg total) by mouth daily. NEEDS OFFICE VISIT, Disp: 90 tablet, Rfl: 1;  metFORMIN (GLUCOPHAGE) 1000 MG tablet, Take 1 tablet (1,000 mg total) by mouth 2 (two) times daily with a meal., Disp: 180 tablet, Rfl: 1;  PARoxetine (PAXIL) 20 MG tablet, Take 1 tablet (20 mg total) by mouth daily., Disp: 90 tablet, Rfl: 1 pravastatin (PRAVACHOL) 40 MG tablet, Take 1 tablet (40 mg total) by mouth daily., Disp: 90 tablet, Rfl: 2;  sitaGLIPtin (JANUVIA) 50 MG tablet, Take 1 tablet (50 mg total) by mouth daily., Disp: 90 tablet, Rfl: 3;  [DISCONTINUED] pioglitazone (ACTOS) 30 MG tablet, Take 30 mg by mouth daily.  , Disp: , Rfl:   Filed Vitals:   11/01/12 1250  BP: 136/70  Pulse: 79  Temp: 98 F (36.7 C)  TempSrc: Oral  Resp: 16  Height: 5\' 4"  (1.626  m)  Weight: 232 lb (105.235 kg)  SpO2: 98%     Review of Systems  Musculoskeletal: Positive for arthralgias (left knee), gait problem and joint swelling (left knee).  All other systems reviewed and are negative.       Objective:   Physical Exam  Nursing note and vitals reviewed. Constitutional: She is oriented to person, place, and time. She appears well-developed and well-nourished.  HENT:  Head: Normocephalic and atraumatic.  Eyes: Conjunctivae and EOM are normal. Pupils are equal, round, and reactive to light.  Neck: Normal range of motion. Neck supple.  Cardiovascular: Normal rate, regular rhythm and normal heart sounds.   Pulmonary/Chest: Effort normal and breath sounds normal.  Abdominal:  Soft. Bowel sounds are normal.  Musculoskeletal: Normal range of motion.  Neurological: She is alert and oriented to person, place, and time. Gait (antalgic gait) abnormal.  Skin: Skin is warm and dry.  Psychiatric: She has a normal mood and affect.   left knee: Inspection shows mild swelling in the medial joint line, tenderness is present along the mediocollateral ligament, there is pain with valgus stress. There are no overlying skin lesions or rash.  Patient does have mild DIP joint swelling and very slight deformity on the right index and middle fingers.  DIAGNOSTIC STUDIES: Oxygen Saturation is 98% on room air, normal by my interpretation.    COORDINATION OF CARE: 1:20 PM- Discussed clinical suspicion of pseudogout and obtaining an x-ray of the patient's left knee.  Discussed administering a cortisol shot after examining the x-ray.  The patient agreed to the treatment plan.   [facility] reading (PRIMARY) by  Dr. Milus Glazier: left knee:  Decreased joint space left knee  Left knee injected with depo medrol 80 and marcaine 1 cc after sterile prep and informed consent.  No complications Results for orders placed in visit on 11/01/12  POCT SEDIMENTATION RATE      Result Value Range   POCT SED RATE 65 (*) 0 - 22 mm/hr       Assessment & Plan:   I personally performed the services described in this documentation, which was scribed in my presence. The recorded information has been reviewed and is accurate.  Knee pain, acute, left - Plan: DG Knee 1-2 Views Left, POCT SEDIMENTATION RATE, Rheumatoid factor, HYDROcodone-acetaminophen (NORCO) 7.5-325 MG per tablet, methylPREDNISolone acetate (DEPO-MEDROL) injection 80 mg  Type 2 diabetes mellitus  DM (diabetes mellitus) - Plan: metFORMIN (GLUCOPHAGE) 1000 MG tablet, sitaGLIPtin (JANUVIA) 50 MG tablet  Hyperlipidemia - Plan: pravastatin (PRAVACHOL) 40 MG tablet  Anxiety - Plan: ALPRAZolam (XANAX) 0.5 MG tablet  Hypertension - Plan:  lisinopril-hydrochlorothiazide (PRINZIDE,ZESTORETIC) 20-25 MG per tablet Patient's blood sugars are up to 160 Signed, Elvina Sidle, MD

## 2012-11-19 ENCOUNTER — Ambulatory Visit: Payer: 59 | Admitting: Family Medicine

## 2012-12-21 ENCOUNTER — Other Ambulatory Visit: Payer: Self-pay | Admitting: Family Medicine

## 2012-12-21 DIAGNOSIS — M25562 Pain in left knee: Secondary | ICD-10-CM

## 2012-12-21 DIAGNOSIS — I10 Essential (primary) hypertension: Secondary | ICD-10-CM

## 2012-12-21 MED ORDER — HYDROCODONE-ACETAMINOPHEN 7.5-325 MG PO TABS: 1.0000 | ORAL_TABLET | Freq: Three times a day (TID) | ORAL | Status: DC | PRN

## 2012-12-21 MED ORDER — LISINOPRIL-HYDROCHLOROTHIAZIDE 20-25 MG PO TABS: 1.0000 | ORAL_TABLET | Freq: Every day | ORAL | Status: DC

## 2013-01-21 ENCOUNTER — Telehealth: Payer: Self-pay

## 2013-01-21 DIAGNOSIS — E119 Type 2 diabetes mellitus without complications: Secondary | ICD-10-CM

## 2013-01-21 NOTE — Telephone Encounter (Signed)
PA needed for Januvia. Completed form and faxed.

## 2013-01-27 NOTE — Telephone Encounter (Signed)
[employer] denied coverage of Januvia because pt has not tried/failed ALL of preferred alternatives: Onglyza, Tradjenta and Nesina. She has only failed the Onglyza. Dr L, do you want to Rx one of the other two pt hasn't tried?

## 2013-02-05 NOTE — Telephone Encounter (Signed)
Dr Elbert EwingsL, I'm not sure you saw this message so I'm resending it.

## 2013-02-08 NOTE — Telephone Encounter (Signed)
Since patient had allergic reaction to Onglyza, we had better not try the other medicines in that class.  Instead, I will refer her to diabetic specialist to search for other strategies

## 2013-02-09 NOTE — Telephone Encounter (Signed)
Spoke with pt, advised message from Dr Milus GlazierLauenstein. Pt agreed. Advised referral sent.

## 2013-02-16 ENCOUNTER — Encounter: Payer: Self-pay | Admitting: *Deleted

## 2013-02-16 ENCOUNTER — Encounter: Payer: Self-pay | Admitting: Internal Medicine

## 2013-02-16 ENCOUNTER — Ambulatory Visit (INDEPENDENT_AMBULATORY_CARE_PROVIDER_SITE_OTHER): Payer: 59 | Admitting: Internal Medicine

## 2013-02-16 VITALS — BP 128/84 | HR 86 | Temp 98.1°F | Resp 12 | Ht 62.5 in | Wt 229.8 lb

## 2013-02-16 DIAGNOSIS — E119 Type 2 diabetes mellitus without complications: Secondary | ICD-10-CM

## 2013-02-16 LAB — HEMOGLOBIN A1C: HEMOGLOBIN A1C: 8.5 % — AB (ref 4.6–6.5)

## 2013-02-16 MED ORDER — LANCETS MISC: Status: DC

## 2013-02-16 MED ORDER — SITAGLIPTIN PHOSPHATE 100 MG PO TABS: 100.0000 mg | ORAL_TABLET | Freq: Every day | ORAL | Status: DC

## 2013-02-16 MED ORDER — GLUCOSE BLOOD VI STRP: ORAL_STRIP | Status: DC

## 2013-02-16 MED ORDER — ONETOUCH ULTRA SYSTEM W/DEVICE KIT: 1.0000 | PACK | Freq: Once | Status: DC

## 2013-02-16 NOTE — Progress Notes (Signed)
Patient ID: Madeline Guerrero, female   DOB: Oct 18, 1950, 63 y.o.   MRN: [id number]  HPI: Madeline Guerrero is a 63 y.o.-year-old female, referred by her PCP, Dr.Lauenstein, for management of DM2, non-insulin-dependent, uncontrolled, without complications.  Patient has been diagnosed with diabetes in 1998; she has not been on insulin before.  Last hemoglobin A1c was: Lab Results  Component Value Date   HGBA1C 6.9 08/20/2012   HGBA1C 7.0 02/10/2012   HGBA1C 6.7 06/23/2011   Pt is on a regimen of: - Metformin 1000 mg po bid She was on Januvia 50 mg daily - 3 mo ago 2/2 insurance not covering it She tried Glyburide >> low CBGs: 40s (2 years ago and again 1 year ago) She tried Actos and stopped b/c of possible bladder CA SE (was on it in 2007) She tried Onglyza >> tongue swelling, SOB >> took Benadril (6 mo ago) She tried Byetta >> sugars decreased too fast (last year)  Pt checks her sugars "every now and then if I feel bad" and they are "never >150": - am: 98-110 - 2h after b'fast: n/c - before lunch: n/c - 2h after lunch: if not eating b'fast: 65-68 - before dinner: n/c - 2h after dinner: n/c - bedtime: n/c - nighttime: n/c No lows. Lowest sugar was 65; she has hypoglycemia awareness in the 60s.  Highest sugar was 178 - in last 6 mo.   She has a One Touch Ultra meter but it is broken.  Pt's meals are: - Breakfast: oatmeal - Lunch: leftovers from supper  - Dinner: meat + veggies + starch - Snacks: no Exercises 3-4 x a week - elliptical - not in last mo. Has a bike at home - 3x a week.   - no CKD, last BUN/creatinine:  Lab Results  Component Value Date   BUN 22 08/20/2012   CREATININE 0.90 08/20/2012  On Lisinopril. - last set of lipids: Lab Results  Component Value Date   CHOL 152 08/20/2012   HDL 63 08/20/2012   LDLCALC 75 08/20/2012   TRIG 68 08/20/2012   CHOLHDL 2.4 08/20/2012  On Pravachol. - last eye exam was in 2012. No DR.  - no numbness and tingling in her  feet.  Pt has FH of DM in mother, GM, sisters, brother.  ROS: Constitutional: + weight gain, no fatigue, + hot flushes Eyes: no blurry vision, no xerophthalmia ENT: no sore throat, no nodules palpated in throat, no dysphagia/odynophagia, no hoarseness Cardiovascular: no CP/SOB/palpitations/leg swelling Respiratory: no cough/SOB Gastrointestinal: no N/V/D/C Musculoskeletal: + muscle/joint aches Skin: no rashes, + hair loss Neurological: no tremors/numbness/tingling/dizziness Psychiatric: no depression/+ anxiety Low libido  Past Medical History  Diagnosis Date  . Diabetes mellitus   . Hypertension    Past Surgical History  Procedure Laterality Date  . Cholecystectomy    . Carpal tunnel release    . Neck surgery     History   Social History  . Marital Status: Married    Spouse Name: N/A    Number of Children: 2: 16 and 18 y/o   Occupational History  . RN   Social History Main Topics  . Smoking status: Current Every Day Smoker -- 0.50 packs/day for 10 years    Types: Cigarettes  . Smokeless tobacco: Not on file  . Alcohol Use: No  . Drug Use: No   Current Outpatient Prescriptions on File Prior to Visit  Medication Sig Dispense Refill  . ALPRAZolam (XANAX) 0.5 MG tablet Take 1 tablet (0.5 mg  total) by mouth 3 (three) times daily.  90 tablet  3  . HYDROcodone-acetaminophen (NORCO) 7.5-325 MG per tablet Take 1 tablet by mouth 3 (three) times daily as needed.  90 tablet  0  . lisinopril-hydrochlorothiazide (PRINZIDE,ZESTORETIC) 20-25 MG per tablet Take 1 tablet by mouth daily.  90 tablet  2  . meloxicam (MOBIC) 15 MG tablet Take 1 tablet (15 mg total) by mouth daily. NEEDS OFFICE VISIT  90 tablet  1  . metFORMIN (GLUCOPHAGE) 1000 MG tablet Take 1 tablet (1,000 mg total) by mouth 2 (two) times daily with a meal.  180 tablet  1  . PARoxetine (PAXIL) 20 MG tablet Take 1 tablet (20 mg total) by mouth daily.  90 tablet  1  . pravastatin (PRAVACHOL) 40 MG tablet Take 1 tablet  (40 mg total) by mouth daily.  90 tablet  2  . sitaGLIPtin (JANUVIA) 50 MG tablet Take 1 tablet (50 mg total) by mouth daily. - not taking   90 tablet  3  . [DISCONTINUED] pioglitazone (ACTOS) 30 MG tablet Take 30 mg by mouth daily.         No current facility-administered medications on file prior to visit.   Allergies  Allergen Reactions  . Onglyza [Saxagliptin] Swelling  . Ultram [Tramadol] Nausea And Vomiting  . Levaquin [Levofloxacin] Rash   History reviewed. No pertinent family history.  PE: BP 128/84  Pulse 86  Temp(Src) 98.1 F (36.7 C) (Oral)  Resp 12  Ht 5' 2.5" (1.588 m)  Wt 229 lb 12.8 oz (104.237 kg)  BMI 41.34 kg/m2  SpO2 98% Wt Readings from Last 3 Encounters:  02/16/13 229 lb 12.8 oz (104.237 kg)  11/01/12 232 lb (105.235 kg)  08/20/12 222 lb (100.699 kg)   Constitutional: overweight, in NAD Eyes: PERRLA, EOMI, no exophthalmos ENT: moist mucous membranes, no thyromegaly, no cervical lymphadenopathy Cardiovascular: RRR, No MRG Respiratory: CTA B Gastrointestinal: abdomen soft, NT, ND, BS+ Musculoskeletal: no deformities, strength intact in all 4 Skin: moist, warm, no rashes Neurological: no tremor with outstretched hands, DTR normal in all 4  ASSESSMENT: 1. DM2, non-insulin-dependent, uncontrolled, without complications  PLAN:  1. Patient with long-standing, controlled diabetes, on oral antidiabetic regimen - only on call. - We discussed about options for treatment, and I suggested to:  Patient Instructions  Please continue Metformin 1000 mg 2x daily. Stay off Januvia for now. Please stop at the lab. Please come back for a follow-up appointment in 3 months. - Strongly advised her to start checking sugars at different times of the day - check once a day or at least every second day - given sugar log and advised how to fill it and to bring it at next appt  - given foot care handout and explained the principles  - given instructions for hypoglycemia  management "15-15 rule"  - advised for yearly eye exams - check A1c today - if high >> add Januvia - Return to clinic in 3 mo with sugar log   Office Visit on 02/16/2013  Component Date Value Range Status  . Hemoglobin A1C 02/16/2013 8.5* 4.6 - 6.5 % Final   Glycemic Control Guidelines for People with Diabetes:Non Diabetic:  <6%Goal of Therapy: <7%Additional Action Suggested:  >8%    HbA1c high >> will start Januvia 100 mg daily in am >> will start a PA form.

## 2013-02-16 NOTE — Patient Instructions (Addendum)
Please continue Metformin 1000 mg 2x daily. Stay off Januvia for now. Please stop at the lab. Please come back for a follow-up appointment in 3 months.  PATIENT INSTRUCTIONS FOR TYPE 2 DIABETES:  **Please join MyChart!** - see attached instructions about how to join   DIET AND EXERCISE Diet and exercise is an important part of diabetic treatment.  We recommended aerobic exercise in the form of brisk walking (working between 40-60% of maximal aerobic capacity, similar to brisk walking) for 150 minutes per week (such as 30 minutes five days per week) along with 3 times per week performing 'resistance' training (using various gauge rubber tubes with handles) 5-10 exercises involving the major muscle groups (upper body, lower body and core) performing 10-15 repetitions (or near fatigue) each exercise. Start at half the above goal but build slowly to reach the above goals. If limited by weight, joint pain, or disability, we recommend daily walking in a swimming pool with water up to waist to reduce pressure from joints while allow for adequate exercise.    BLOOD GLUCOSES Monitoring your blood glucoses is important for continued management of your diabetes. Please check your blood glucoses 2-4 times a day: fasting, before meals and at bedtime (you can rotate these measurements - e.g. one day check before the 3 meals, the next day check before 2 of the meals and before bedtime, etc.   HYPOGLYCEMIA (low blood sugar) Hypoglycemia is usually a reaction to not eating, exercising, or taking too much insulin/ other diabetes drugs.  Symptoms include tremors, sweating, hunger, confusion, headache, etc. Treat IMMEDIATELY with 15 grams of Carbs:   4 glucose tablets    cup regular juice/soda   2 tablespoons raisins   4 teaspoons sugar   1 tablespoon honey Recheck blood glucose in 15 mins and repeat above if still symptomatic/blood glucose <100. Please contact our office at [phone] if you have questions  about how to next handle your insulin.  RECOMMENDATIONS TO REDUCE YOUR RISK OF DIABETIC COMPLICATIONS: * Take your prescribed MEDICATION(S). * Follow a DIABETIC diet: Complex carbs, fiber rich foods, heart healthy fish twice weekly, (monounsaturated and polyunsaturated) fats * AVOID saturated/trans fats, high fat foods, >2,300 mg salt per day. * EXERCISE at least 5 times a week for 30 minutes or preferably daily.  * DO NOT SMOKE OR DRINK more than 1 drink a day. * Check your FEET every day. Do not wear tightfitting shoes. Contact us if you develop an ulcer * See your EYE doctor once a year or more if needed * Get a FLU shot once a year * Get a PNEUMONIA vaccine once before and once after age 62 years  GOALS:  * Your Hemoglobin A1c of <7%  * fasting sugars need to be <130 * after meals sugars need to be <180 (2h after you start eating) * Your Systolic BP should be 140 or lower  * Your Diastolic BP should be 80 or lower  * Your HDL (Good Cholesterol) should be 40 or higher  * Your LDL (Bad Cholesterol) should be 100 or lower  * Your Triglycerides should be 150 or lower  * Your Urine microalbumin (kidney function) should be <30 * Your Body Mass Index should be 25 or lower   We will be glad to help you achieve these goals. Our telephone number is: [phone].

## 2013-02-27 ENCOUNTER — Other Ambulatory Visit: Payer: Self-pay | Admitting: Family Medicine

## 2013-03-01 ENCOUNTER — Ambulatory Visit: Payer: 59

## 2013-03-04 ENCOUNTER — Other Ambulatory Visit: Payer: Self-pay | Admitting: *Deleted

## 2013-03-04 ENCOUNTER — Encounter: Payer: Self-pay | Admitting: *Deleted

## 2013-03-04 MED ORDER — LINAGLIPTIN 5 MG PO TABS: 5.0000 mg | ORAL_TABLET | Freq: Every morning | ORAL | Status: DC

## 2013-03-04 NOTE — Telephone Encounter (Signed)
Called pt and lvm advising her that the PA for Januvia was denied. We did the appeals process and it was denied as well. Dr Elvera LennoxGherghe wants you to try Tradjenta 5 mg. Advised pt that we are sending it to her pharmacy, to call if she has any questions and concerns.

## 2013-03-14 ENCOUNTER — Telehealth: Payer: Self-pay

## 2013-03-14 NOTE — Telephone Encounter (Signed)
Patient called upset that she got a letter for a bill for 223.00 to pay to [facility]. I asked her to please call billing on Monday when they are open to discuss that she has met her deductible. She refused to call them tomorrow and told me "to figure out a way to get them to call her back and stick a note on their door." I told her I would try my best :)   -just documenting what happened and going to print this message to put in interoffice box for MR to take on Monday next door. -  BEST: [phone]

## 2013-03-16 NOTE — Telephone Encounter (Signed)
Pt is would like a refill on vicoden. Best# [phone]

## 2013-03-17 ENCOUNTER — Other Ambulatory Visit: Payer: Self-pay | Admitting: Radiology

## 2013-03-17 ENCOUNTER — Other Ambulatory Visit: Payer: Self-pay | Admitting: Family Medicine

## 2013-03-17 DIAGNOSIS — M1990 Unspecified osteoarthritis, unspecified site: Secondary | ICD-10-CM

## 2013-03-17 DIAGNOSIS — M549 Dorsalgia, unspecified: Secondary | ICD-10-CM

## 2013-03-17 DIAGNOSIS — M25569 Pain in unspecified knee: Secondary | ICD-10-CM

## 2013-03-17 DIAGNOSIS — G8929 Other chronic pain: Secondary | ICD-10-CM

## 2013-03-17 MED ORDER — HYDROCODONE-ACETAMINOPHEN 7.5-750 MG PO TABS: 1.0000 | ORAL_TABLET | Freq: Three times a day (TID) | ORAL | Status: DC | PRN

## 2013-03-18 ENCOUNTER — Telehealth: Payer: Self-pay | Admitting: Family Medicine

## 2013-03-18 MED ORDER — HYDROCODONE-ACETAMINOPHEN 7.5-325 MG PO TABS: 1.0000 | ORAL_TABLET | Freq: Three times a day (TID) | ORAL | Status: DC | PRN

## 2013-03-18 NOTE — Telephone Encounter (Signed)
Patient is bringing her rx back that was given to her 03/17/13 for hydrocodone. The rx was 7.5/750 and when she took it to pharmacy they told her they do not make the 750 strength any longer. She is bringing rx back to get a new one written.

## 2013-03-18 NOTE — Telephone Encounter (Signed)
Pt was given rx for Vicodin 7.5-750 which is not available.  I printed an new rx for Vicodin 7.5-325  To you FYI Dr LElbert Ewings

## 2013-03-18 NOTE — Telephone Encounter (Signed)
Pt has picked up ?

## 2013-03-24 ENCOUNTER — Ambulatory Visit (INDEPENDENT_AMBULATORY_CARE_PROVIDER_SITE_OTHER): Payer: 59 | Admitting: Family Medicine

## 2013-03-24 VITALS — BP 160/70 | HR 85 | Temp 98.8°F | Resp 18 | Ht 62.5 in | Wt 224.0 lb

## 2013-03-24 DIAGNOSIS — B000 Eczema herpeticum: Secondary | ICD-10-CM

## 2013-03-24 DIAGNOSIS — M25579 Pain in unspecified ankle and joints of unspecified foot: Secondary | ICD-10-CM

## 2013-03-24 DIAGNOSIS — E119 Type 2 diabetes mellitus without complications: Secondary | ICD-10-CM

## 2013-03-24 DIAGNOSIS — M25569 Pain in unspecified knee: Secondary | ICD-10-CM

## 2013-03-24 DIAGNOSIS — G5600 Carpal tunnel syndrome, unspecified upper limb: Secondary | ICD-10-CM

## 2013-03-24 DIAGNOSIS — M25519 Pain in unspecified shoulder: Secondary | ICD-10-CM

## 2013-03-24 DIAGNOSIS — M25562 Pain in left knee: Principal | ICD-10-CM

## 2013-03-24 DIAGNOSIS — M25561 Pain in right knee: Secondary | ICD-10-CM

## 2013-03-24 LAB — POCT SEDIMENTATION RATE: POCT SED RATE: 64 mm/hr — AB (ref 0–22)

## 2013-03-24 NOTE — Progress Notes (Signed)
Subjective:    Patient ID: Madeline Guerrero, female    DOB: [birth year], 63 y.o.   MRN: [id number] This chart was scribed for Robyn Haber, MD by Anastasia Pall, ED Scribe. This patient was seen in room 09 and the patient's care was started at 6:15 PM.  Chief Complaint  Patient presents with   Follow-up    regular check up   HPI Madeline Guerrero is a 63 y.o. female Pt presents for follow up and medication refill. She has h/o DM, anxiety, and depression.   She states her DM levels have been going up and has not been feeling well recently, stating her energy levels have been lower. She states she has not been herself.   She reports constant, right shoulder and bilateral knee pain, onset yesterday evening. She reports the cold weather and rain exacerbates her joint pain. She reports recent falls due to her joint weakness. She states her vision has been gradually worsening. She denies urinary symptoms, and any other associated symptoms.  She states her A1C was 8.5 on 02/27/13.   Patient Active Problem List   Diagnosis Date Noted   Diabetes mellitus type II 03/08/2011    Class: Chronic   Anxiety and depression 03/08/2011   Past Surgical History  Procedure Laterality Date   Cholecystectomy     Carpal tunnel release     Neck surgery     Prior to Admission medications   Medication Sig Start Date End Date Taking? Authorizing Provider  ALPRAZolam Duanne Moron) 0.5 MG tablet Take 1 tablet (0.5 mg total) by mouth 3 (three) times daily. 11/01/12  Yes Robyn Haber, MD  Blood Glucose Monitoring Suppl (ONE TOUCH ULTRA SYSTEM KIT) W/DEVICE KIT 1 kit by Does not apply route once. 02/16/13  Yes Philemon Kingdom, MD  glucose blood test strip Use as instructed 02/16/13  Yes Philemon Kingdom, MD  HYDROcodone-acetaminophen (NORCO) 7.5-325 MG per tablet Take 1 tablet by mouth every 8 (eight) hours as needed. 03/18/13  Yes Theda Sers, PA-C  Lancets MISC Check sugars every day  02/16/13  Yes Philemon Kingdom, MD  linagliptin (TRADJENTA) 5 MG TABS tablet Take 1 tablet (5 mg total) by mouth every morning. 03/04/13  Yes Philemon Kingdom, MD  lisinopril-hydrochlorothiazide (PRINZIDE,ZESTORETIC) 20-25 MG per tablet Take 1 tablet by mouth daily. 12/21/12  Yes Robyn Haber, MD  meloxicam (MOBIC) 15 MG tablet Take 1 tablet (15 mg total) by mouth daily. PATIENT NEEDS OFFICE VISIT FOR ADDITIONAL REFILLS   Yes Robyn Haber, MD  metFORMIN (GLUCOPHAGE) 1000 MG tablet Take 1 tablet (1,000 mg total) by mouth 2 (two) times daily with a meal. 11/01/12  Yes Robyn Haber, MD  PARoxetine (PAXIL) 20 MG tablet Take 1 tablet (20 mg total) by mouth daily. 07/30/12  Yes Robyn Haber, MD  pravastatin (PRAVACHOL) 40 MG tablet Take 1 tablet (40 mg total) by mouth daily. 11/01/12  Yes Robyn Haber, MD   Review of Systems  Constitutional: Positive for fatigue. Negative for fever.  Genitourinary: Negative.   Musculoskeletal: Positive for arthralgias (right shoulder, bilateral knees).  Neurological: Positive for weakness. Negative for syncope.      Objective:   Physical Exam CONSTITUTIONAL: Well developed/well nourished HEAD: Normocephalic/atraumatic EYES: EOMI/PERRL ENMT: Mucous membranes moist NECK: supple no meningeal signs SPINE:entire spine nontender CV: S1/S2 noted, no murmurs/rubs/gallops noted LUNGS: Lungs are clear to auscultation bilaterally, no apparent distress ABDOMEN: soft, nontender, no rebound or guarding GU:no cva tenderness NEURO: Pt is awake/alert, moves all extremitiesx4 EXTREMITIES: pulses normal,  full ROM. Left shoulder is elevated relative to right shoulder.  SKIN: warm, color normal PSYCH: no abnormalities of mood noted Blood sugars reviewed: Numbers over the last week off from 111-185, majority are under 150    Assessment & Plan:   Knee pain, bilateral - Plan: Ambulatory referral to Rheumatology, ANA, POCT SEDIMENTATION RATE, Rheumatoid factor  Pain  in joint, shoulder region - Plan: Ambulatory referral to Rheumatology, ANA, POCT SEDIMENTATION RATE, Rheumatoid factor  Type 2 diabetes mellitus  Carpal tunnel syndrome - Plan: Ambulatory referral to Rheumatology, ANA, POCT SEDIMENTATION RATE, Rheumatoid factor  Signed, Robyn Haber, MD      I personally performed the services described in this documentation, which was scribed in my presence. The recorded information has been reviewed and is accurate.

## 2013-03-25 LAB — RHEUMATOID FACTOR: Rhuematoid fact SerPl-aCnc: <10 IU/mL (ref <=14)

## 2013-03-26 LAB — ANA: Anti Nuclear Antibody(ANA): NEGATIVE

## 2013-04-02 ENCOUNTER — Encounter: Payer: Self-pay | Admitting: Gastroenterology

## 2013-04-12 ENCOUNTER — Encounter: Payer: Self-pay | Admitting: Physical Medicine & Rehabilitation

## 2013-04-15 ENCOUNTER — Encounter: Payer: Self-pay | Admitting: *Deleted

## 2013-04-15 DIAGNOSIS — M1712 Unilateral primary osteoarthritis, left knee: Secondary | ICD-10-CM | POA: Insufficient documentation

## 2013-04-22 ENCOUNTER — Ambulatory Visit (INDEPENDENT_AMBULATORY_CARE_PROVIDER_SITE_OTHER): Payer: 59 | Admitting: Family Medicine

## 2013-04-22 VITALS — BP 146/86 | HR 86 | Temp 98.3°F | Resp 18 | Ht 68.0 in | Wt 222.0 lb

## 2013-04-22 DIAGNOSIS — F419 Anxiety disorder, unspecified: Secondary | ICD-10-CM

## 2013-04-22 DIAGNOSIS — E213 Hyperparathyroidism, unspecified: Secondary | ICD-10-CM

## 2013-04-22 DIAGNOSIS — M549 Dorsalgia, unspecified: Secondary | ICD-10-CM

## 2013-04-22 DIAGNOSIS — E119 Type 2 diabetes mellitus without complications: Secondary | ICD-10-CM

## 2013-04-22 DIAGNOSIS — I10 Essential (primary) hypertension: Secondary | ICD-10-CM

## 2013-04-22 DIAGNOSIS — F411 Generalized anxiety disorder: Secondary | ICD-10-CM

## 2013-04-22 LAB — POCT GLYCOSYLATED HEMOGLOBIN (HGB A1C): Hemoglobin A1C: 7.9

## 2013-04-22 MED ORDER — HYDROCODONE-ACETAMINOPHEN 7.5-325 MG PO TABS: 1.0000 | ORAL_TABLET | Freq: Three times a day (TID) | ORAL | Status: DC | PRN

## 2013-04-22 MED ORDER — LISINOPRIL-HYDROCHLOROTHIAZIDE 20-25 MG PO TABS: 1.0000 | ORAL_TABLET | Freq: Every day | ORAL | Status: DC

## 2013-04-22 MED ORDER — ALPRAZOLAM 0.5 MG PO TABS: 0.5000 mg | ORAL_TABLET | Freq: Three times a day (TID) | ORAL | Status: DC

## 2013-04-22 MED ORDER — METFORMIN HCL 1000 MG PO TABS: 1000.0000 mg | ORAL_TABLET | Freq: Two times a day (BID) | ORAL | Status: DC

## 2013-04-23 LAB — LIPID PANEL
Cholesterol: 178 mg/dL (ref 0–200)
HDL: 62 mg/dL (ref >39)
LDL Cholesterol: 97 mg/dL (ref 0–99)
Total CHOL/HDL Ratio: 2.9 Ratio
Triglycerides: 95 mg/dL (ref <150)
VLDL: 19 mg/dL (ref 0–40)

## 2013-04-23 LAB — COMPREHENSIVE METABOLIC PANEL
ALT: 19 U/L (ref 0–35)
AST: 21 U/L (ref 0–37)
Albumin: 4.1 g/dL (ref 3.5–5.2)
Alkaline Phosphatase: 83 U/L (ref 39–117)
BUN: 27 mg/dL — ABNORMAL HIGH (ref 6–23)
CO2: 27 mEq/L (ref 19–32)
Calcium: 9.9 mg/dL (ref 8.4–10.5)
Chloride: 102 mEq/L (ref 96–112)
Creat: 0.9 mg/dL (ref 0.50–1.10)
Glucose, Bld: 112 mg/dL — ABNORMAL HIGH (ref 70–99)
Potassium: 4.1 mEq/L (ref 3.5–5.3)
Sodium: 139 mEq/L (ref 135–145)
Total Bilirubin: 0.3 mg/dL (ref 0.2–1.2)
Total Protein: 7.2 g/dL (ref 6.0–8.3)

## 2013-04-23 LAB — SEDIMENTATION RATE: Sed Rate: 51 mm/hr — ABNORMAL HIGH (ref 0–22)

## 2013-04-25 NOTE — Progress Notes (Signed)
This chart was scribed for Robyn Haber, MD by Vernell Barrier, Medical Scribe. This patient's care was started at 8:08 PM.  Patient ID: Madeline Guerrero  MRN: [id number], DOB: October 30, 1950, 63 y.o.  Date of Encounter: 04/22/2013, 8:08 PM  Primary Physician: Robyn Haber, MD  Chief Complaint:  HPI: 63 y.o. year old female with history below presents with follow up for back pain. States pain is worse today that before. Report pain is in lower back and down left leg.  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 She'll be out of town for 3 weeks. 3 more active and requests more pain pills. She's having difficulty with her husband who is anticipating surgery in a month.  Past Medical History   Diagnosis  Date   .  Diabetes mellitus    .  Hypertension     Home Meds:  Prior to Admission medications   Medication  Sig  Start Date  End Date  Taking?  Authorizing Provider   ALPRAZolam Duanne Moron) 0.5 MG tablet  Take 1 tablet (0.5 mg total) by mouth 3 (three) times daily.  11/01/12   Yes  Robyn Haber, MD   Blood Glucose Monitoring Suppl (ONE TOUCH ULTRA SYSTEM KIT) W/DEVICE KIT  1 kit by Does not apply route once.  02/16/13   Yes  Philemon Kingdom, MD   glucose blood test strip  Use as instructed  02/16/13   Yes  Philemon Kingdom, MD   HYDROcodone-acetaminophen (NORCO) 7.5-325 MG per tablet  Take 1 tablet by mouth every 8 (eight) hours as needed.  03/18/13   Yes  Theda Sers, PA-C   Lancets MISC  Check sugars every day  02/16/13   Yes  Philemon Kingdom, MD   linagliptin (TRADJENTA) 5 MG TABS tablet  Take 1 tablet (5 mg total) by mouth every morning.  03/04/13   Yes  Philemon Kingdom, MD   lisinopril-hydrochlorothiazide (PRINZIDE,ZESTORETIC) 20-25 MG per tablet  Take 1 tablet by mouth daily.  12/21/12   Yes  Robyn Haber, MD   meloxicam (MOBIC) 15 MG tablet  Take 1 tablet (15 mg total) by mouth daily. PATIENT NEEDS OFFICE VISIT FOR ADDITIONAL REFILLS    Yes  Robyn Haber, MD   metFORMIN  (GLUCOPHAGE) 1000 MG tablet  Take 1 tablet (1,000 mg total) by mouth 2 (two) times daily with a meal.  11/01/12   Yes  Robyn Haber, MD   PARoxetine (PAXIL) 20 MG tablet  Take 1 tablet (20 mg total) by mouth daily.  07/30/12   Yes  Robyn Haber, MD   pravastatin (PRAVACHOL) 40 MG tablet  Take 1 tablet (40 mg total) by mouth daily.  11/01/12   Yes  Robyn Haber, MD   Allergies:  Allergies   Allergen  Reactions   .  Onglyza [Saxagliptin]  Swelling   .  Ultram [Tramadol]  Nausea And Vomiting   .  Levaquin [Levofloxacin]  Rash    History    Social History   .  Marital Status:  Married     Spouse Name:  N/A     Number of Children:  N/A   .  Years of Education:  N/A    Occupational History   .  Not on file.    Social History Main Topics   .  Smoking status:  Current Some Day Smoker -- 0.50 packs/day for 10 years     Types:  Cigarettes   .  Smokeless tobacco:  Not on file   .  Alcohol Use:  No   .  Drug Use:  No   .  Sexual Activity:  Not on file    Other Topics  Concern   .  Not on file    Social History Narrative   .  No narrative on file    Review of Systems:  Constitutional: negative for chills, fever, night sweats, weight changes, or fatigue  HEENT: negative for vision changes, hearing loss, congestion, rhinorrhea, ST, epistaxis, or sinus pressure  Cardiovascular: negative for chest pain or palpitations  Respiratory: negative for hemoptysis, wheezing, shortness of breath, or cough  Abdominal: negative for abdominal pain, nausea, vomiting, diarrhea, or constipation  Dermatological: negative for rash  Neurologic: negative for headache, dizziness, or syncope  All other systems reviewed and are otherwise negative with the exception to those above and in the HPI.  Physical Exam:  Blood pressure 146/86, pulse 86, temperature 98.3 F (36.8 C), temperature source Oral, resp. rate 18, height _0  (1.727 m), weight 222 lb (100.699 kg), SpO2 100.00%., Body mass index is  33.76 kg/(m^2).  General: Well developed, well nourished, in no acute distress.  Head: Normocephalic, atraumatic, eyes without discharge, sclera non-icteric, nares are without discharge. Bilateral auditory canals clear, TM's are without perforation, pearly grey and translucent with reflective cone of light bilaterally. Oral cavity moist, posterior pharynx without exudate, erythema, peritonsillar abscess, or post nasal drip.  Neck: Supple. No thyromegaly. Full ROM. No lymphadenopathy.  Lungs: Clear bilaterally to auscultation without wheezes, rales, or rhonchi. Breathing is unlabored.  Heart: RRR with S1 S2. No murmurs, rubs, or gallops appreciated.  Abdomen: Soft, non-tender, non-distended with normoactive bowel sounds. No hepatomegaly. No rebound/guarding. No obvious abdominal masses.  Msk: Strength and tone normal for age.  Extremities/Skin: Warm and dry. No clubbing or cyanosis. No edema. No rashes or suspicious lesions.  Neuro: Alert and oriented X 3. Moves all extremities spontaneously. Gait is normal. CNII-XII grossly in tact.  Psych: Responds to questions appropriately with a normal affect.   Labs:  ASSESSMENT AND PLAN:  63 y.o. year old female with multiple problems including social conflicts with her husband and lack of money requiring travel.  Back pain - Plan: HYDROcodone-acetaminophen (NORCO) 7.5-325 MG per tablet, POCT glycosylated hemoglobin (Hb A1C), Lipid panel, Comprehensive metabolic panel, Sedimentation rate, CANCELED: POCT SEDIMENTATION RATE  Hypertension - Plan: lisinopril-hydrochlorothiazide (PRINZIDE,ZESTORETIC) 20-25 MG per tablet, POCT glycosylated hemoglobin (Hb A1C), Lipid panel, Comprehensive metabolic panel, Sedimentation rate, CANCELED: POCT SEDIMENTATION RATE  DM (diabetes mellitus) - Plan: metFORMIN (GLUCOPHAGE) 1000 MG tablet, POCT glycosylated hemoglobin (Hb A1C), Lipid panel, Comprehensive metabolic panel, Sedimentation rate, CANCELED: POCT SEDIMENTATION RATE   Anxiety - Plan: ALPRAZolam (XANAX) 0.5 MG tablet, POCT glycosylated hemoglobin (Hb A1C), Lipid panel, Comprehensive metabolic panel, Sedimentation rate, CANCELED: POCT SEDIMENTATION RATE  Signed,  Robyn Haber, MD  Signed,  Robyn Haber, MD  04/22/2013  8:08 PM

## 2013-04-28 ENCOUNTER — Encounter: Payer: Self-pay | Admitting: Radiology

## 2013-05-18 ENCOUNTER — Ambulatory Visit: Payer: 59 | Admitting: Internal Medicine

## 2013-05-21 ENCOUNTER — Encounter: Payer: 59 | Attending: Physical Medicine & Rehabilitation

## 2013-05-21 ENCOUNTER — Ambulatory Visit: Payer: 59 | Admitting: Physical Medicine & Rehabilitation

## 2013-06-22 ENCOUNTER — Ambulatory Visit: Payer: 59 | Admitting: Internal Medicine

## 2013-06-24 ENCOUNTER — Telehealth: Payer: Self-pay | Admitting: Radiology

## 2013-06-24 NOTE — Telephone Encounter (Signed)
I spoke to Mr. Sherrie George. The multiple calls/texts were for his wife, Harrietta. I have spoken to her twice. She had an appointment 06/22/13 with her endocrinologist and she missed that appointment. She said she got lost and went to the wrong doctor's office. She is out of her diabetic medications. She did not know which one she was out of so she went home and called me back to tell me which one it was. The medication is

## 2013-06-24 NOTE — Telephone Encounter (Signed)
(  continuation) Tradjenta and was prescribed by her endocrinologist. I asked if she had called that physician for the refill and she had not. I suggested that she call them to get that refill. She asked about her Metformin refill and suggested that all her diabetic medications should be managed by the endocrinologist. She then accused me of not caring and hung up.  Of note, she told that she got Dr. Cain Saupe cell phone number from him so she could call him for her pain medication.

## 2013-06-28 ENCOUNTER — Ambulatory Visit: Payer: 59 | Admitting: Family Medicine

## 2013-06-29 ENCOUNTER — Telehealth: Payer: Self-pay

## 2013-06-29 NOTE — Telephone Encounter (Signed)
Patient LMOM on referral to [facility]. I dont see a referral in there do you donna? Please advise. Thanks!

## 2013-06-30 NOTE — Telephone Encounter (Signed)
Gave patient the phone number and the address of [facility] as she requested

## 2013-07-06 ENCOUNTER — Encounter: Payer: Self-pay | Admitting: Physical Medicine & Rehabilitation

## 2013-07-06 ENCOUNTER — Other Ambulatory Visit: Payer: Self-pay

## 2013-07-06 ENCOUNTER — Ambulatory Visit (HOSPITAL_BASED_OUTPATIENT_CLINIC_OR_DEPARTMENT_OTHER): Payer: 59 | Admitting: Physical Medicine & Rehabilitation

## 2013-07-06 ENCOUNTER — Encounter: Payer: 59 | Attending: Physical Medicine & Rehabilitation

## 2013-07-06 ENCOUNTER — Telehealth: Payer: Self-pay

## 2013-07-06 VITALS — BP 149/74 | HR 99 | Resp 14 | Ht 64.0 in | Wt 216.8 lb

## 2013-07-06 DIAGNOSIS — Z5189 Encounter for other specified aftercare: Secondary | ICD-10-CM | POA: Insufficient documentation

## 2013-07-06 DIAGNOSIS — Z79899 Other long term (current) drug therapy: Secondary | ICD-10-CM | POA: Insufficient documentation

## 2013-07-06 DIAGNOSIS — Z5181 Encounter for therapeutic drug level monitoring: Secondary | ICD-10-CM | POA: Insufficient documentation

## 2013-07-06 DIAGNOSIS — G8929 Other chronic pain: Secondary | ICD-10-CM

## 2013-07-06 DIAGNOSIS — M76899 Other specified enthesopathies of unspecified lower limb, excluding foot: Secondary | ICD-10-CM

## 2013-07-06 DIAGNOSIS — IMO0002 Reserved for concepts with insufficient information to code with codable children: Secondary | ICD-10-CM

## 2013-07-06 DIAGNOSIS — M1712 Unilateral primary osteoarthritis, left knee: Secondary | ICD-10-CM

## 2013-07-06 DIAGNOSIS — M7062 Trochanteric bursitis, left hip: Secondary | ICD-10-CM

## 2013-07-06 DIAGNOSIS — M1710 Unilateral primary osteoarthritis, unspecified knee: Secondary | ICD-10-CM

## 2013-07-06 DIAGNOSIS — G894 Chronic pain syndrome: Secondary | ICD-10-CM | POA: Insufficient documentation

## 2013-07-06 DIAGNOSIS — M545 Low back pain, unspecified: Secondary | ICD-10-CM

## 2013-07-06 MED ORDER — DICLOFENAC SODIUM 1 % TD GEL: 2.0000 g | Freq: Four times a day (QID) | TRANSDERMAL | Status: DC

## 2013-07-06 NOTE — Patient Instructions (Signed)

## 2013-07-06 NOTE — Progress Notes (Signed)
Subjective:    Patient ID: Madeline Guerrero, female    DOB: 1950/11/17, 63 y.o.   MRN: [id number]  HPI Chief complaint low back pain History of chronic low back pain but also has multiple joint pains. Left knee has been problematic. Left knee x-rays demonstrating mild to moderate medial compartment osteoarthritis. Patient has also had rheumatology evaluation. Rheumatoid factor was negative, ANA was negative but sed rate was elevated to 64 Has also seen primary care physician. Good relief with left knee injection for approximately 3 months. Past surgical history includes ACDF C4-C5 by Dr. Otelia SergeantNitka around 2007 No history of back surgery. 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 Does a lot of lifting. Patient has left hip pain that worsens at night when she delays on that side  Pain Inventory Average Pain 7 Pain Right Now 5 My pain is constant, burning and aching  In the last 24 hours, has pain interfered with the following? General activity 6 Relation with others 7 Enjoyment of life 8 What TIME of day is your pain at its worst? evening Sleep (in general) Fair  Pain is worse with: walking and bending Pain improves with: medication Relief from Meds: 5  Mobility walk without assistance ability to climb steps?  yes do you drive?  yes  Function employed # of hrs/week 48 what is your job? nurse  Neuro/Psych trouble walking depression anxiety  Prior Studies Any changes since last visit?  no  Physicians involved in your care Orthopedist Nitka   History reviewed. No pertinent family history. History   Social History  . Marital Status: Married    Spouse Name: N/A    Number of Children: N/A  . Years of Education: N/A   Social History Main Topics  . Smoking status: Current Some Day Smoker -- 0.50 packs/day for 10 years    Types: Cigarettes  . Smokeless tobacco: Never Used  . Alcohol Use: No    . Drug Use: No  . Sexual Activity: None   Other Topics Concern  . None   Social History Narrative  . None   Past Surgical History  Procedure Laterality Date  . Cholecystectomy    . Carpal tunnel release    . Neck surgery    . Spine surgery  2007    cervical  . Knee arthroscopy Left    Past Medical History  Diagnosis Date  . Diabetes mellitus   . Hypertension    BP 149/74  Pulse 99  Resp 14  Ht 5\' 4"  (1.626 m)  Wt 216 lb 12.8 oz (98.34 kg)  BMI 37.20 kg/m2  SpO2 94%  Opioid Risk Score: 1 Fall Risk Score: Moderate Fall Risk (6-13 points) (educated and handout given for fall prevention in the home)  Review of Systems  Cardiovascular: Positive for leg swelling.  Musculoskeletal: Positive for arthralgias, back pain and gait problem.  Psychiatric/Behavioral: Positive for dysphoric mood. The patient is nervous/anxious.   All other systems reviewed and are negative.      Objective:   Physical Exam  Nursing note and vitals reviewed. Constitutional: She is oriented to person, place, and time. She appears well-developed and well-nourished.  HENT:  Head: Normocephalic and atraumatic.  Eyes: Pupils are equal, round, and reactive to light.  Neck: Normal range of motion.  Neurological: She is alert and oriented to person, place, and time.  Psychiatric: She has a normal mood and affect.   Decreased  S1 sensation bilaterally  Also decreased stocking distribution pinprick Tenderness to palpation over left trochanteric area No tenderness palpation the lumbar paraspinal muscles. Limited lumbar range of motion in extension as well as lateral bending. Intact lumbar flexion Cervical range of motion mildly diminished with flexion extension Heberden's nodule right index finger     Assessment & Plan:  1.  chronic low back pain, suspect lumbar spondylosis plus minus degenerative disc will check x-rays of the lumbar spine no recent imaging. 2. Cervical postlaminectomy syndrome  overall doing fairly well with this. #3. Left trochanteric bursitis pain does not respond to anti-inflammatories. Has tried Mobic and diclofenac per os Will do injection today, exercises given that she can share with her trainer  Trochanteric bursa injection LEFT  Indication Trochanteric bursitis. Exam has tenderness over the greater trochanter of the hip. Pain has not responded to conservative care such as exercise therapy and oral medications. Pain interferes with sleep or with mobility Informed consent was obtained after describing risks and benefits of the procedure with the patient these include bleeding bruising and infection. Patient has signed written consent form. Patient placed in a lateral decubitus position with the affected hip superior. Point of maximal pain was palpated marked and prepped with Betadine and entered with a needle to bone contact. Needle slightly withdrawn then 6mg  of betamethasone with 4 cc 1% lidocaine were injected. Patient tolerated procedure well. Post procedure instructions given.  4. Bilateral hand osteoarthritis trial of diclofenac gel #5. Left knee moderate osteoarthritis, will schedule for Synvisc-1 under ultrasound guidance  6. Chronic pain syndrome multifactorial as listed above. Uses small amounts of hydrocodone no more than 15 mg per day. We discussed that given this relatively low dose, would first trial with Tylenol #4, 1 per os 3 times a day, this would reduce the frequency of office visits for opioid monitoring

## 2013-07-06 NOTE — Telephone Encounter (Signed)
As already noted, I can no longer refill these medications due to federal guidelines

## 2013-07-06 NOTE — Telephone Encounter (Signed)
Pt states she has gone to the pain clinic but they will not prescribe pain meds on first viisit,please Check with Dr Milus GlazierLauenstein to reifill pain meds.   Best phone for pt is [phone]  [facility]  [address]

## 2013-07-07 ENCOUNTER — Telehealth: Payer: Self-pay

## 2013-07-07 NOTE — Telephone Encounter (Signed)
Lm for pt- unable to prescribe medications due to referral to pain management she will need to contact them for refills.

## 2013-07-07 NOTE — Telephone Encounter (Signed)
Pt is aware that dr Milus Glazierlauenstein will not be filling her pain meds, but she is confused because the pain clinic only does pain meds after results of drug screen and they told patient that they sent a letter to dr Milus Glazierlauenstein  That until they see her again he will control the pain meds

## 2013-07-08 NOTE — Telephone Encounter (Signed)
Please advise 

## 2013-07-08 NOTE — Telephone Encounter (Signed)
I am awaiting the letter.  I have no idea why they won't fill her pain meds.  The drug screen takes 48 hours.  Nothing I say or have said should influence their consultation

## 2013-07-08 NOTE — Telephone Encounter (Signed)
Advised pt of Dr. Tommi RumpsLauensteins notes and pt was very upset and stated that we should call the pain clinic.  Pt states that she went to the pain clinic and that they do not prescribe pain medication on the first visit and she needs her pain medications.  She states that she will be going over Dr. Tommi RumpsLauensteins head because he will not write her medications.  In the middle of me speaking she hung up on me.

## 2013-08-28 ENCOUNTER — Other Ambulatory Visit: Payer: Self-pay | Admitting: Internal Medicine

## 2013-08-31 ENCOUNTER — Telehealth: Payer: Self-pay | Admitting: *Deleted

## 2013-09-07 DIAGNOSIS — Z0271 Encounter for disability determination: Secondary | ICD-10-CM

## 2013-09-09 ENCOUNTER — Encounter: Payer: 59 | Attending: Physical Medicine & Rehabilitation

## 2013-09-09 ENCOUNTER — Ambulatory Visit: Payer: 59 | Admitting: Physical Medicine & Rehabilitation

## 2013-09-09 DIAGNOSIS — Z5189 Encounter for other specified aftercare: Secondary | ICD-10-CM | POA: Insufficient documentation

## 2013-09-09 DIAGNOSIS — Z5181 Encounter for therapeutic drug level monitoring: Secondary | ICD-10-CM | POA: Insufficient documentation

## 2013-09-09 DIAGNOSIS — IMO0002 Reserved for concepts with insufficient information to code with codable children: Secondary | ICD-10-CM

## 2013-09-09 DIAGNOSIS — M1710 Unilateral primary osteoarthritis, unspecified knee: Secondary | ICD-10-CM | POA: Insufficient documentation

## 2013-09-09 DIAGNOSIS — Z79899 Other long term (current) drug therapy: Secondary | ICD-10-CM | POA: Insufficient documentation

## 2013-09-09 DIAGNOSIS — M76899 Other specified enthesopathies of unspecified lower limb, excluding foot: Secondary | ICD-10-CM | POA: Insufficient documentation

## 2013-09-09 DIAGNOSIS — G894 Chronic pain syndrome: Secondary | ICD-10-CM | POA: Insufficient documentation

## 2013-09-22 ENCOUNTER — Ambulatory Visit: Payer: 59 | Admitting: Internal Medicine

## 2013-09-22 DIAGNOSIS — Z0289 Encounter for other administrative examinations: Secondary | ICD-10-CM

## 2013-11-27 NOTE — Telephone Encounter (Signed)
error 

## 2014-03-10 ENCOUNTER — Encounter: Payer: Self-pay | Admitting: Gastroenterology

## 2014-05-19 ENCOUNTER — Other Ambulatory Visit: Payer: Self-pay | Admitting: Orthopedic Surgery

## 2014-05-19 DIAGNOSIS — M25561 Pain in right knee: Secondary | ICD-10-CM

## 2014-05-26 ENCOUNTER — Ambulatory Visit
Admission: RE | Admit: 2014-05-26 | Discharge: 2014-05-26 | Disposition: A | Discharge status: Home / Self Care | Payer: 59 | Source: Ambulatory Visit | Attending: Orthopedic Surgery | Admitting: Orthopedic Surgery

## 2014-05-26 DIAGNOSIS — M25561 Pain in right knee: Secondary | ICD-10-CM

## 2014-06-30 ENCOUNTER — Ambulatory Visit: Payer: 59 | Admitting: Physical Therapy

## 2014-06-30 ENCOUNTER — Ambulatory Visit: Payer: 59 | Attending: Orthopedic Surgery | Admitting: Physical Therapy

## 2014-06-30 ENCOUNTER — Encounter: Payer: Self-pay | Admitting: Physical Therapy

## 2014-06-30 DIAGNOSIS — R262 Difficulty in walking, not elsewhere classified: Secondary | ICD-10-CM | POA: Insufficient documentation

## 2014-06-30 DIAGNOSIS — M25561 Pain in right knee: Secondary | ICD-10-CM | POA: Diagnosis present

## 2014-06-30 DIAGNOSIS — M25661 Stiffness of right knee, not elsewhere classified: Secondary | ICD-10-CM

## 2014-06-30 DIAGNOSIS — M6281 Muscle weakness (generalized): Secondary | ICD-10-CM | POA: Diagnosis not present

## 2014-06-30 NOTE — Therapy (Signed)
[facility] [address] [place], [place], [address] Phone: [phone]   Fax:  [phone]  Physical Therapy Evaluation  Patient Details  Name: Madeline Guerrero MRN: [id number] Date of Birth: 17-Jan-1950 Referring Provider:  Billee Cashing, MD  Encounter Date: 06/30/2014      PT End of Session - 06/30/14 1321    Visit Number 1   Number of Visits 8   Date for PT Re-Evaluation 08/25/14   Authorization Type [employer] Patient reports $50 copay and wants to limit visits   PT Start Time 1152   PT Stop Time 1237   PT Time Calculation (min) 45 min   Activity Tolerance Patient tolerated treatment well;Patient limited by pain      Past Medical History  Diagnosis Date  . Diabetes mellitus   . Hypertension     Past Surgical History  Procedure Laterality Date  . Cholecystectomy    . Carpal tunnel release    . Neck surgery    . Spine surgery  2007    cervical  . Knee arthroscopy Left     There were no vitals filed for this visit.  Visit Diagnosis:  Right knee pain - Plan: PT plan of care cert/re-cert  Joint stiffness of knee, right - Plan: PT plan of care cert/re-cert  Muscle weakness of lower extremity - Plan: PT plan of care cert/re-cert  Difficulty in walking - Plan: PT plan of care cert/re-cert      Subjective Assessment - 06/30/14 1158    Subjective Started falling after feeling lots of clicking in her knee starting in March 3 at a church function doing the tango.   States her knee stayed swollen.  Had X-ray and MRI which showed medial meniscus tear.  Arthroscopic surgery 06/20/14. Doing well until 6/13 when took the stitches out and then now can't do SLR.    Using crutch around the house.     Pertinent History left knee OA, back OA, right ankle/foot OA;  left knee scope 9 years ago   Limitations Walking;Standing;House hold activities  diff putting shows on   How long can you sit comfortably? 30 min   How long  can you stand comfortably? not at all   Diagnostic tests x-ray, MRI   Patient Stated Goals I don't want to walk with a limp or be in pain   Currently in Pain? Yes   Pain Score 7    Pain Location Knee   Pain Orientation Right  inferior right knee   Pain Type Surgical pain   Pain Onset 1 to 4 weeks ago   Pain Frequency Constant   Aggravating Factors  standing, walking   Pain Relieving Factors pain medication; ice            [facility] PT Assessment - 06/30/14 1209    Assessment   Medical Diagnosis right knee meniscus debridement   Onset Date/Surgical Date 06/20/14   Hand Dominance Right   Next MD Visit 07/20/14   Prior Therapy after cervical fusion and CTS   Precautions   Precautions None   Restrictions   Weight Bearing Restrictions No   Balance Screen   Has the patient fallen in the past 6 months Yes   How many times? 2-3   Has the patient had a decrease in activity level because of a fear of falling?  Yes   Is the patient reluctant to leave their home because of a fear of falling?  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   Leisure read, teach bible study, walk dogs,    Observation/Other Assessments   Focus on Therapeutic Outcomes (FOTO)  76% limit   Posture/Postural Control   Posture Comments Moderate    ROM / Strength   AROM / PROM / Strength AROM;Strength   AROM   AROM Assessment Site Knee   Right/Left Knee Right;Left   Right Knee Extension 11  supine 6 degrees   Right Knee Flexion 68  70 supine   Left Knee Extension 0   Left Knee Flexion 105  114 supine    Strength   Strength Assessment Site Knee   Right/Left Knee Right;Left   Right Knee Flexion 3-/5   Right Knee Extension 3-/5   Left Knee Flexion 4/5   Left Knee Extension 4/5   Functional Gait  Assessment   Gait assessed  Yes   Gait Level Surface --  Very antalgic without assistive device   Change in Gait Speed --  31m walk 20.5 sec no AD                           PT Education - 06/30/14 1320    Education provided Yes   Education Details knee level 1 exercises; use of ice/elevation for edema control; use of crutch on left side for pain control/decreased limp   Person(s) Educated Patient   Methods Explanation;Demonstration;Handout   Comprehension Verbalized understanding;Returned demonstration          PT Short Term Goals - 06/30/14 1726    PT SHORT TERM GOAL #1   Title Patient will have right knee flexion to 80 degrees needed for greater ease getting in/out of the car.   Time 4   Period Weeks   Status New   PT SHORT TERM GOAL #2   Title Patient will have LE strength to manuever her leg on/off the bed and in/out of the car without UE use to compensate.   Time 4   Period Weeks   Status New   PT SHORT TERM GOAL #3   Title Patient will be able to ambulate with or without crutch 300 feet needed for short distance community ambulation.   Time 4   Period Weeks   Status New           PT Long Term Goals - 06/30/14 1729    PT LONG TERM GOAL #1   Title Patient will be independent in safe, self progression for further ROM and strengthening   Time 8   Period Weeks   Status New   PT LONG TERM GOAL #2   Title Right knee flexion to 95 degrees needed for greater ease with going up/down steps at home   Time 8   Period Weeks   Status New   PT LONG TERM GOAL #3   Title Patient will have 3+/5 quad and HS strength needed for standing 10 min for basic household chores like cooking   Time 8   Period Weeks   Status New  PT LONG TERM GOAL #4   Title Gait  speed >.68m/sec needed for community ambulation and indicating decreased incidence of adverse events   Time 8   Period Weeks   Status New   PT LONG TERM GOAL #5   Title FOTO functional outcome score improved from 76% limitation to 48% indicating improved function with less pain   Time 8   Period Weeks   Status New               Plan - 06/30/14 1322    Clinical Impression Statement The patient is a 64 year old female who reports that in early March she was doing a church dance activity and was doing the tango when her right knee began to bother her.  Following that, she began to have clicking and knee give-way/falling.  She was diagnosed with a medial meniscus tear and underwent meniscectomy on 06/20/14.  She reports she was doing well until her stitches were removed earlier this week and since then she has had increased swelling and more difficulty lifting her leg.  Her gait is very antalgic.  She states typically she uses 1 crutch but she does not have this with her today.  She drove herself but used her left LE.  She is having great difficulty going up and down her numerous steps to enter her home and inside her home to reach her bedroom.  She is unable to work as a Engineer, civil (consulting).  She states she has difficulty putting her shoes on/off.  Moderate swelling.  No increased skin temp.  Right knee AROM in sitting 11-68, supine 6-70.  Left knee AROM 0-105.  Quad and HS strength 3-/5.  Gait speed .17m/sec.    Pt will benefit from skilled therapeutic intervention in order to improve on the following deficits Abnormal gait;Decreased activity tolerance;Decreased range of motion;Pain;Increased edema;Decreased strength   Rehab Potential Good   Clinical Impairments Affecting Rehab Potential diabetes;  patient requests 1x/week only secondary to high co-pay   PT Frequency 1x / week  at patient's request secondary to financial concerns   PT Duration 8 weeks   PT Treatment/Interventions ADLs/Self Care Home  Management;Electrical Stimulation;Cryotherapy;Therapeutic exercise;Neuromuscular re-education;Patient/family education;Manual techniques;Taping   PT Next Visit Plan Try Nu-Step;  low level quad activation, low level AAROM and AROM for 10 days post op; vasocompression         Problem List Patient Active Problem List   Diagnosis Date Noted  . Chronic pain syndrome 07/06/2013  . Trochanteric bursitis of left hip 07/06/2013  . Chronic low back pain 07/06/2013  . Arthritis of left knee 04/15/2013  . Diabetes mellitus type II 03/08/2011    Class: Chronic  . Anxiety and depression 03/08/2011    Vivien Presto 06/30/2014, 5:36 PM  [facility] [address] [place], [place], [address] Phone: [phone]   Fax:  [phone]  Lavinia Sharps, PT 06/30/2014 5:36 PM Phone: [phone] Fax: [phone]

## 2014-06-30 NOTE — Patient Instructions (Signed)
   Copyright  VHI. All rights reserved.  HIP: Flexion / KNEE: Extension, Straight Leg Raise   Raise leg, keeping knee straight. Perform slowly. _10-15__ reps per set, __3-4_ sets per day, __7_ days per week   Copyright  VHI. All rights reserved.  Heel Slide   Bend knee and pull heel toward buttocks. Hold __3-5__ seconds. Return. Repeat with other knee. Repeat __10-15__ times. Do __3-4__ sessions per day.  http://gt2.exer.us/372   Copyright  VHI. All rights reserved.     Raise leg until knee is straight. ___10-15 reps per set, __3-4_ sets per day, __7_ days per week  Copyright  VHI. All rights reserved.  Short Arc [employer] a large can or rolled towel under leg. Straighten knee and leg. Hold ___3-5_ seconds. Repeat with other leg. Repeat __10-15__ times. Do 3-4____ sessions per day.  http://gt2.exer.us/365   Copyright  VHI. All rights reserved.  Quad Set   Slowly tighten muscles on thigh of straight leg while counting out loud to _5___. Repeat with other leg. Repeat _10___ times. Do _3-4___ sessions per day.  http://gt2.exer.us/361   Copyright  VHI. All rights reserved.

## 2014-07-01 ENCOUNTER — Ambulatory Visit: Payer: 59 | Admitting: Physical Therapy

## 2014-07-01 DIAGNOSIS — M25561 Pain in right knee: Secondary | ICD-10-CM

## 2014-07-01 DIAGNOSIS — R262 Difficulty in walking, not elsewhere classified: Secondary | ICD-10-CM

## 2014-07-01 DIAGNOSIS — M6281 Muscle weakness (generalized): Secondary | ICD-10-CM

## 2014-07-01 DIAGNOSIS — M25661 Stiffness of right knee, not elsewhere classified: Secondary | ICD-10-CM

## 2014-07-01 NOTE — Therapy (Signed)
[facility] Outpatient Rehabilitation [facility] [address] [place], [place], [address] Phone: [phone]   Fax:  [phone]  Physical Therapy Treatment  Patient Details  Name: Madeline Guerrero MRN: [id number] Date of Birth: 1950-05-13 Referring Provider:  Billee Cashing, MD  Encounter Date: 07/01/2014      PT End of Session - 07/01/14 0954    Visit Number 2   Date for PT Re-Evaluation 08/25/14   Authorization Type U[employer] Patient reports $50 copay and wants to limit visits   PT Start Time 0936   PT Stop Time 1030   PT Time Calculation (min) 54 min   Activity Tolerance Patient tolerated treatment well      Past Medical History  Diagnosis Date  . Diabetes mellitus   . Hypertension     Past Surgical History  Procedure Laterality Date  . Cholecystectomy    . Carpal tunnel release    . Neck surgery    . Spine surgery  2007    cervical  . Knee arthroscopy Left     There were no vitals filed for this visit.  Visit Diagnosis:  Right knee pain  Joint stiffness of knee, right  Muscle weakness of lower extremity  Difficulty in walking      Subjective Assessment - 07/01/14 0941    Subjective Drove herself to therapy and was rushing to get here, forgot her crutch.  Took a pain pill this morning.  A lot of my pain is in my back.  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   Currently in Pain? Yes   Pain Score 4    Pain Location Knee   Pain Orientation Right   Pain Type Surgical pain   Pain Onset 1 to 4 weeks ago   Pain Frequency Constant   Aggravating Factors  standing, walking   Pain Relieving Factors pain meds            [facility] PT Assessment - 06/30/14 1209    Assessment   Medical Diagnosis right knee meniscus debridement   Onset Date/Surgical Date 06/20/14   Hand Dominance Right   Next MD Visit 07/20/14   Prior Therapy after cervical fusion and CTS   Precautions   Precautions None   Restrictions   Weight Bearing Restrictions No   Balance Screen   Has the patient fallen in the past 6 months Yes   How many times? 2-3   Has the patient had a decrease in activity level because of a fear of falling?  Yes   Is the patient reluctant to leave their home because of a fear of falling?  Yes   Home Environment   Living Environment Private residence   Living Arrangements Spouse/significant other   Available Help at Discharge Family   Home Access Stairs to enter   Entrance Stairs-Number of Steps 8   Home Layout Multi-level   Alternate Level Stairs-Number of Steps flight   Home Equipment Crutches   Prior Function   Level of Independence Independent   Vocation --  trying to get disability; was working full time before nurse   Leisure read, teach bible study, walk dogs,    Observation/Other Assessments   Focus on Therapeutic Outcomes (FOTO)  76% limit   Posture/Postural Control   Posture Comments Moderate    ROM / Strength   AROM / PROM / Strength AROM;Strength   AROM   AROM Assessment Site Knee   Right/Left Knee Right;Left   Right Knee Extension 11  supine 6  degrees   Right Knee Flexion 68  70 supine   Left Knee Extension 0   Left Knee Flexion 105  114 supine   Strength   Strength Assessment Site Knee   Right/Left Knee Right;Left   Right Knee Flexion 3-/5   Right Knee Extension 3-/5   Left Knee Flexion 4/5   Left Knee Extension 4/5   Functional Gait  Assessment   Gait assessed  Yes   Gait Level Surface --  Very antalgic without assistive device   Change in Gait Speed --  28m walk 20.5 sec no AD                     [facility] Adult PT Treatment/Exercise - 07/01/14 0944    Exercises   Exercises Knee/Hip   Knee/Hip Exercises: Aerobic   Stationary Bike Nu-Step L1  8 min Seat 11 UEs/LEs   Knee/Hip Exercises: Standing   Heel Raises 15 reps   Terminal Knee Extension Strengthening;Right;1 set;15 reps   Lateral Step Up Right;1 set;10 reps;Hand Hold: 2;Step Height: 2"   Forward Step Up Right;1  set;Hand Hold: 2;10 reps;Step Height: 2"   SLS with Vectors Right leg 3 ways 10x   Knee/Hip Exercises: Seated   Stool Scoot - Round Trips Quad sets on floor 15x   Knee/Hip Exercises: Supine   Short Arc Quad Sets AROM;Right;20 reps   Bridges AROM;Both;1 set;10 reps  on green ball   Knee Flexion AAROM;Both;1 set;20 reps  on ball   Other Supine Knee Exercises HS sets on ball 10x   Modalities   Modalities Cryotherapy   Cryotherapy   Number Minutes Cryotherapy 15 Minutes   Cryotherapy Location Knee   Type of Cryotherapy Other (comment)  vasocompression mild 32 degrees supine                PT Education - 06/30/14 1320    Education provided Yes   Education Details knee level 1 exercises; use of ice/elevation for edema control; use of crutch on left side for pain control/decreased limp   Person(s) Educated Patient   Methods Explanation;Demonstration;Handout   Comprehension Verbalized understanding;Returned demonstration          PT Short Term Goals - 07/01/14 1006    PT SHORT TERM GOAL #1   Title Patient will have right knee flexion to 80 degrees needed for greater ease getting in/out of the car.   Time 4   Period Weeks   Status On-going   PT SHORT TERM GOAL #2   Title Patient will have LE strength to manuever her leg on/off the bed and in/out of the car without UE use to compensate.   Time 4   Period Weeks   Status On-going   PT SHORT TERM GOAL #3   Title Patient will be able to ambulate with or without crutch 300 feet needed for short distance community ambulation.   Time 4   Period Weeks   Status On-going           PT Long Term Goals - 07/01/14 1007    PT LONG TERM GOAL #1   Title Patient will be independent in safe, self progression for further ROM and strengthening   Time 8   Period Weeks   Status On-going   PT LONG TERM GOAL #2   Title Right knee flexion to 95 degrees needed for greater ease with going up/down steps at home   Time 8   Period Weeks    Status  Achieved   PT LONG TERM GOAL #3   Title Patient will have 3+/5 quad and HS strength needed for standing 10 min for basic household chores like cooking   Time 8   Period Weeks   Status On-going   PT LONG TERM GOAL #4   Title Gait speed >.15m/sec needed for community ambulation and indicating decreased incidence of adverse events   Time 8   Period Weeks   Status New   PT LONG TERM GOAL #5   Title FOTO functional outcome score improved from 76% limitation to 48% indicating improved function with less pain   Time 8   Period Weeks   Status On-going               Plan - 07/01/14 0955    Clinical Impression Statement Patient moving with greater ease today, she states she thinks the pain pill is helping.  Improved motor control noted in quads.  Therapist monitoring pain response and close supervision for safety in standing.   PT Next Visit Plan Nu-Step or Bike; right knee AAROM, AROM; low level strengthening; cryotherapy        Problem List Patient Active Problem List   Diagnosis Date Noted  . Chronic pain syndrome 07/06/2013  . Trochanteric bursitis of left hip 07/06/2013  . Chronic low back pain 07/06/2013  . Arthritis of left knee 04/15/2013  . Diabetes mellitus type II 03/08/2011    Class: Chronic  . Anxiety and depression 03/08/2011    Vivien Presto 07/01/2014, 11:22 AM  [facility] [address] [place], [place], [address] Phone: [phone]   Fax:  [phone]   Lavinia Sharps, PT 07/01/2014 11:23 AM Phone: [phone] Fax: [phone]

## 2014-07-05 ENCOUNTER — Ambulatory Visit: Payer: 59

## 2014-07-05 DIAGNOSIS — M6281 Muscle weakness (generalized): Secondary | ICD-10-CM

## 2014-07-05 DIAGNOSIS — M25561 Pain in right knee: Secondary | ICD-10-CM | POA: Diagnosis not present

## 2014-07-05 DIAGNOSIS — M25661 Stiffness of right knee, not elsewhere classified: Secondary | ICD-10-CM

## 2014-07-05 DIAGNOSIS — R262 Difficulty in walking, not elsewhere classified: Secondary | ICD-10-CM

## 2014-07-05 NOTE — Therapy (Signed)
[facility] Outpatient Rehabilitation [facility] [address] [place], [place], [address] Phone: [phone]   Fax:  [phone]  Physical Therapy Treatment  Patient Details  Name: Madeline Guerrero MRN: [id number] Date of Birth: 10/27/1950 Referring Provider:  Billee Cashing, MD  Encounter Date: 07/05/2014      PT End of Session - 07/05/14 1449    Visit Number 3   Number of Visits 8   Date for PT Re-Evaluation 08/25/14   PT Start Time 0215   PT Stop Time 0305   PT Time Calculation (min) 50 min   Activity Tolerance Patient limited by pain   Behavior During Therapy [facility] for tasks assessed/performed      Past Medical History  Diagnosis Date  . Diabetes mellitus   . Hypertension     Past Surgical History  Procedure Laterality Date  . Cholecystectomy    . Carpal tunnel release    . Neck surgery    . Spine surgery  2007    cervical  . Knee arthroscopy Left     There were no vitals filed for this visit.  Visit Diagnosis:  Right knee pain  Joint stiffness of knee, right  Muscle weakness of lower extremity  Difficulty in walking      Subjective Assessment - 07/05/14 1422    Subjective Took pain at 9 AM so painful now.   Currently in Pain? Yes   Pain Score 6    Pain Location Knee   Pain Orientation Right   Pain Type Surgical pain   Pain Onset 1 to 4 weeks ago   Pain Frequency Constant   Aggravating Factors  Bending , standing , walking   Pain Relieving Factors medicaiton   Multiple Pain Sites No                         [facility] Adult PT Treatment/Exercise - 07/05/14 1424    Knee/Hip Exercises: Aerobic   Stationary Bike Nustep L2 LE only 6 min   Knee/Hip Exercises: Supine   Short Arc Quad Sets AROM;Right;1 set;15 reps   Bridges AROM;Both;1 set;15 reps   Bridges Limitations knees over bolster short arc.    Straight Leg Raises AROM;Right;1 set;20 reps   Other Supine Knee/Hip Exercises LE flexion with legs on   ball, then hip ext LT leg into ball and RT knee and hip flexion off ball x 15   Knee/Hip Exercises: Sidelying   Hip ABduction AROM;Right;1 set;15 reps   Hip ABduction Limitations with movemtn forwardd and back (flex/ext)   Cryotherapy   Number Minutes Cryotherapy 12 Minutes   Cryotherapy Location Knee   Type of Cryotherapy Ice pack   Manual Therapy   Manual Therapy Soft tissue mobilization   Soft tissue mobilization With use of rock blade STW to quad and around patella and laterla knee  followed by SAQ                  PT Short Term Goals - 07/01/14 1006    PT SHORT TERM GOAL #1   Title Patient will have right knee flexion to 80 degrees needed for greater ease getting in/out of the car.   Time 4   Period Weeks   Status On-going   PT SHORT TERM GOAL #2   Title Patient will have LE strength to manuever her leg on/off the bed and in/out of the car without UE use to compensate.   Time 4   Period Weeks  Status On-going   PT SHORT TERM GOAL #3   Title Patient will be able to ambulate with or without crutch 300 feet needed for short distance community ambulation.   Time 4   Period Weeks   Status On-going           PT Long Term Goals - 07/01/14 1007    PT LONG TERM GOAL #1   Title Patient will be independent in safe, self progression for further ROM and strengthening   Time 8   Period Weeks   Status On-going   PT LONG TERM GOAL #2   Title Right knee flexion to 95 degrees needed for greater ease with going up/down steps at home   Time 8   Period Weeks   Status Achieved   PT LONG TERM GOAL #3   Title Patient will have 3+/5 quad and HS strength needed for standing 10 min for basic household chores like cooking   Time 8   Period Weeks   Status On-going   PT LONG TERM GOAL #4   Title Gait speed >.29m/sec needed for community ambulation and indicating decreased incidence of adverse events   Time 8   Period Weeks   Status New   PT LONG TERM GOAL #5   Title FOTO  functional outcome score improved from 76% limitation to 48% indicating improved function with less pain   Time 8   Period Weeks   Status On-going               Plan - 07/05/14 1450    Clinical Impression Statement Madeline Guerrero is still significantly limited due to pain. She was cooperative with her exercise program.    PT Next Visit Plan Continue exercise for strength and range, cold   Consulted and Agree with Plan of Care Patient        Problem List Patient Active Problem List   Diagnosis Date Noted  . Chronic pain syndrome 07/06/2013  . Trochanteric bursitis of left hip 07/06/2013  . Chronic low back pain 07/06/2013  . Arthritis of left knee 04/15/2013  . Diabetes mellitus type II 03/08/2011    Class: Chronic  . Anxiety and depression 03/08/2011    Caprice Red PT 07/05/2014, 2:52 PM  [facility] [place] [address] [place], [place], [address] Phone: [phone]   Fax:  [phone]

## 2014-07-14 ENCOUNTER — Ambulatory Visit: Payer: 59 | Admitting: Physical Therapy

## 2014-07-14 DIAGNOSIS — R262 Difficulty in walking, not elsewhere classified: Secondary | ICD-10-CM

## 2014-07-14 DIAGNOSIS — M25661 Stiffness of right knee, not elsewhere classified: Secondary | ICD-10-CM

## 2014-07-14 DIAGNOSIS — M25561 Pain in right knee: Secondary | ICD-10-CM | POA: Diagnosis not present

## 2014-07-14 DIAGNOSIS — M6281 Muscle weakness (generalized): Secondary | ICD-10-CM

## 2014-07-14 NOTE — Patient Instructions (Signed)
Single Leg - Eyes Open   Holding support, lift right leg while maintaining balance over other leg. Progress to removing hands from support surface for longer periods of time. Hold__30 sec-60__ seconds. Repeat __1-2__ times per session. Do __3__ sessions per day.  Copyright  VHI. All rights reserved.  Hamstring Step 1   Straighten right knee. Raise right leg until stretch felt in posterior knee.  Hold _30__ seconds. Relax knee by returning foot to start. Repeat _3__ times.  Copyright  VHI. All rights reserved.  Gastroc / Heel Cord Stretch - On Step   Stand with heels over edge of stair. Holding rail, lower heels until stretch is felt in calf of legs. Hold 30 seconds Repeat __3_ times. Do _3__ times per day.  Copyright  VHI. All rights reserved.

## 2014-07-15 NOTE — Therapy (Signed)
[facility] Outpatient Rehabilitation [facility] [address] [place], [place], [address] Phone: [phone]   Fax:  [phone]  Physical Therapy Treatment  Patient Details  Name: Madeline Guerrero MRN: [id number] Date of Birth: Aug 22, 1950 Referring Provider:  Billee Cashing, MD  Encounter Date: 07/14/2014      PT End of Session - 07/14/14 0940    Visit Number 4   Number of Visits 8   Date for PT Re-Evaluation 08/25/14   Authorization Type UHC Patient reports $50 copay and wants to limit visits   PT Start Time 0936   PT Stop Time 1030   PT Time Calculation (min) 54 min      Past Medical History  Diagnosis Date  . Diabetes mellitus   . Hypertension     Past Surgical History  Procedure Laterality Date  . Cholecystectomy    . Carpal tunnel release    . Neck surgery    . Spine surgery  2007    cervical  . Knee arthroscopy Left     There were no vitals filed for this visit.  Visit Diagnosis:  Right knee pain  Joint stiffness of knee, right  Muscle weakness of lower extremity  Difficulty in walking      Subjective Assessment - 07/14/14 0939    Subjective I had a shot in my back yesterday for a disc issue. I thought it was working at first.    Currently in Pain? No/denies   Aggravating Factors  bending, standing, walking   Pain Relieving Factors medication            [facility] PT Assessment - 07/14/14 0941    AROM   Right Knee Flexion 103                     [facility] Adult PT Treatment/Exercise - 07/15/14 0001    Knee/Hip Exercises: Stretches   Active Hamstring Stretch 3 reps;30 seconds   Active Hamstring Stretch Limitations supine with strap   Gastroc Stretch 3 reps;30 seconds   Gastroc Stretch Limitations at 2 inch step or off edge of step   Knee/Hip Exercises: Standing   Heel Raises 20 reps   Knee Flexion AROM;Right;2 sets;10 reps   Forward Step Up 1 set;10 reps;Hand Hold: 1;Step Height: 4"   Knee/Hip  Exercises: Seated   Long Arc Quad Strengthening;Right;2 sets;20 reps   Long Arc Quad Weight 2 lbs.   Knee/Hip Exercises: Supine   Short Arc Quad Sets AROM;20 reps   Straight Leg Raises AROM;Right;1 set;20 reps  1 rest break   Modalities   Modalities Cryotherapy   Cryotherapy   Number Minutes Cryotherapy 15 Minutes   Cryotherapy Location Knee   Type of Cryotherapy Ice pack                  PT Short Term Goals - 07/01/14 1006    PT SHORT TERM GOAL #1   Title Patient will have right knee flexion to 80 degrees needed for greater ease getting in/out of the car.   Time 4   Period Weeks   Status On-going   PT SHORT TERM GOAL #2   Title Patient will have LE strength to manuever her leg on/off the bed and in/out of the car without UE use to compensate.   Time 4   Period Weeks   Status On-going   PT SHORT TERM GOAL #3   Title Patient will be able to ambulate with or without crutch 300 feet  needed for short distance community ambulation.   Time 4   Period Weeks   Status On-going           PT Long Term Goals - 07/01/14 1007    PT LONG TERM GOAL #1   Title Patient will be independent in safe, self progression for further ROM and strengthening   Time 8   Period Weeks   Status On-going   PT LONG TERM GOAL #2   Title Right knee flexion to 95 degrees needed for greater ease with going up/down steps at home   Time 8   Period Weeks   Status Achieved   PT LONG TERM GOAL #3   Title Patient will have 3+/5 quad and HS strength needed for standing 10 min for basic household chores like cooking   Time 8   Period Weeks   Status On-going   PT LONG TERM GOAL #4   Title Gait speed >.8014m/sec needed for community ambulation and indicating decreased incidence of adverse events   Time 8   Period Weeks   Status New   PT LONG TERM GOAL #5   Title FOTO functional outcome score improved from 76% limitation to 48% indicating improved function with less pain   Time 8   Period Weeks    Status On-going               Plan - 07/14/14 0949    Clinical Impression Statement Pt reports riding bike at gym with level 1 resistance for 15 minutes every day. She is also performing HEP 3x per day. Instructed pt in hamstring and calf stretching for HEP.    PT Next Visit Plan Continue exercise for strength and range, cold        Problem List Patient Active Problem List   Diagnosis Date Noted  . Chronic pain syndrome 07/06/2013  . Trochanteric bursitis of left hip 07/06/2013  . Chronic low back pain 07/06/2013  . Arthritis of left knee 04/15/2013  . Diabetes mellitus type II 03/08/2011    Class: Chronic  . Anxiety and depression 03/08/2011    Sherrie Mustacheonoho, Gildo Crisco McGee, PTA 07/15/2014, 8:07 AM  [facility] [address] [place], [place], [address] Phone: [phone]   Fax:  [phone]

## 2014-07-20 ENCOUNTER — Ambulatory Visit: Payer: 59 | Attending: Orthopedic Surgery

## 2014-07-20 DIAGNOSIS — R262 Difficulty in walking, not elsewhere classified: Secondary | ICD-10-CM | POA: Insufficient documentation

## 2014-07-20 DIAGNOSIS — M6281 Muscle weakness (generalized): Secondary | ICD-10-CM | POA: Diagnosis present

## 2014-07-20 DIAGNOSIS — M25661 Stiffness of right knee, not elsewhere classified: Secondary | ICD-10-CM | POA: Diagnosis present

## 2014-07-20 DIAGNOSIS — M25561 Pain in right knee: Secondary | ICD-10-CM | POA: Insufficient documentation

## 2014-07-20 NOTE — Therapy (Signed)
[facility] Outpatient Rehabilitation [facility] [address] [place], [place], [address] Phone: [phone]   Fax:  [phone]  Physical Therapy Treatment  Patient Details  Name: Madeline Guerrero MRN: [id number] Date of Birth: [birth year] Referring Provider:  Billee Cashing, MD  Encounter Date: 07/20/2014      PT End of Session - 07/20/14 1508    Visit Number 5   Number of Visits 8   Date for PT Re-Evaluation 08/25/14   PT Start Time 0215   PT Stop Time 0315   PT Time Calculation (min) 60 min   Activity Tolerance Patient tolerated treatment well   Behavior During Therapy [facility] for tasks assessed/performed      Past Medical History  Diagnosis Date  . Diabetes mellitus   . Hypertension     Past Surgical History  Procedure Laterality Date  . Cholecystectomy    . Carpal tunnel release    . Neck surgery    . Spine surgery  2007    cervical  . Knee arthroscopy Left     There were no vitals filed for this visit.  Visit Diagnosis:  Right knee pain  Joint stiffness of knee, right  Muscle weakness of lower extremity  Difficulty in walking      Subjective Assessment - 07/20/14 1422    Subjective My knee still hurts   Currently in Pain? Yes   Pain Score 2    Pain Location Knee   Pain Orientation Right   Pain Descriptors / Indicators Aching;Throbbing   Pain Type Surgical pain   Pain Onset More than a month ago   Pain Frequency Constant   Aggravating Factors  bending and walking   Pain Relieving Factors medication   Multiple Pain Sites No                         [facility] Adult PT Treatment/Exercise - 07/20/14 1430    Knee/Hip Exercises: Aerobic   Stationary Bike L1 5 min    Knee/Hip Exercises: Standing   Heel Raises 20 reps   Knee Flexion AROM;Right;2 sets;10 reps   Knee Flexion Limitations 3 pounds   Lateral Step Up Right;1 set;15 reps;Hand Hold: 2;Step Height: 4"   Other Standing Knee Exercises hip abduciton RT  and LT x 12 each   Knee/Hip Exercises: Seated   Long Arc Quad Strengthening;Right;2 sets;20 reps   Long Arc Quad Weight 3 lbs.   Knee/Hip Exercises: Supine   Short Arc Quad Sets AROM;20 reps   Short Arc Quad Sets Limitations with pressure into ball behind RTknee   Straight Leg Raises AROM;Right;1 set;20 reps   Knee/Hip Exercises: Sidelying   Hip ABduction AROM;Right;1 set;15 reps   Cryotherapy   Number Minutes Cryotherapy 15 Minutes   Cryotherapy Location Knee   Type of Cryotherapy Ice pack                  PT Short Term Goals - 07/01/14 1006    PT SHORT TERM GOAL #1   Title Patient will have right knee flexion to 80 degrees needed for greater ease getting in/out of the car.   Time 4   Period Weeks   Status On-going   PT SHORT TERM GOAL #2   Title Patient will have LE strength to manuever her leg on/off the bed and in/out of the car without UE use to compensate.   Time 4   Period Weeks   Status On-going   PT SHORT  TERM GOAL #3   Title Patient will be able to ambulate with or without crutch 300 feet needed for short distance community ambulation.   Time 4   Period Weeks   Status On-going           PT Long Term Goals - 07/01/14 1007    PT LONG TERM GOAL #1   Title Patient will be independent in safe, self progression for further ROM and strengthening   Time 8   Period Weeks   Status On-going   PT LONG TERM GOAL #2   Title Right knee flexion to 95 degrees needed for greater ease with going up/down steps at home   Time 8   Period Weeks   Status Achieved   PT LONG TERM GOAL #3   Title Patient will have 3+/5 quad and HS strength needed for standing 10 min for basic household chores like cooking   Time 8   Period Weeks   Status On-going   PT LONG TERM GOAL #4   Title Gait speed >.3074m/sec needed for community ambulation and indicating decreased incidence of adverse events   Time 8   Period Weeks   Status New   PT LONG TERM GOAL #5   Title FOTO functional  outcome score improved from 76% limitation to 48% indicating improved function with less pain   Time 8   Period Weeks   Status On-going               Plan - 07/20/14 1509    Clinical Impression Statement She continues to have pain but is doing well with exercieses. She will continue with exercises   PT Next Visit Plan Continue exercise for strength and range, cold   Consulted and Agree with Plan of Care Patient        Problem List Patient Active Problem List   Diagnosis Date Noted  . Chronic pain syndrome 07/06/2013  . Trochanteric bursitis of left hip 07/06/2013  . Chronic low back pain 07/06/2013  . Arthritis of left knee 04/15/2013  . Diabetes mellitus type II 03/08/2011    Class: Chronic  . Anxiety and depression 03/08/2011    Caprice RedChasse, Bettylou Frew M PT 07/20/2014, 3:12 PM  [facility] [address] [place], [place], [address] Phone: [phone]   Fax:  [phone]

## 2014-07-28 ENCOUNTER — Ambulatory Visit: Payer: 59 | Admitting: Physical Therapy

## 2014-08-04 ENCOUNTER — Ambulatory Visit: Payer: 59

## 2014-08-04 DIAGNOSIS — M25561 Pain in right knee: Secondary | ICD-10-CM | POA: Diagnosis not present

## 2014-08-04 DIAGNOSIS — M6281 Muscle weakness (generalized): Secondary | ICD-10-CM

## 2014-08-04 DIAGNOSIS — R262 Difficulty in walking, not elsewhere classified: Secondary | ICD-10-CM

## 2014-08-04 DIAGNOSIS — M25661 Stiffness of right knee, not elsewhere classified: Secondary | ICD-10-CM

## 2014-08-04 NOTE — Therapy (Signed)
[facility] [address] [place], [place], [address] Phone: [phone]   Fax:  [phone]  Physical Therapy Treatment  Patient Details  Name: Madeline Guerrero MRN: [id number] Date of Birth: 24-Apr-1950 Referring Provider:  Billee Cashing, MD  Encounter Date: 08/04/2014      PT End of Session - 08/04/14 1055    Visit Number 6   Number of Visits 8   Date for PT Re-Evaluation 08/25/14   PT Start Time 1027   PT Stop Time 1100   PT Time Calculation (min) 33 min   Activity Tolerance Patient tolerated treatment well   Behavior During Therapy [facility] for tasks assessed/performed      Past Medical History  Diagnosis Date  . Diabetes mellitus   . Hypertension     Past Surgical History  Procedure Laterality Date  . Cholecystectomy    . Carpal tunnel release    . Neck surgery    . Spine surgery  2007    cervical  . Knee arthroscopy Left     There were no vitals filed for this visit.  Visit Diagnosis:  Right knee pain  Muscle weakness of lower extremity  Joint stiffness of knee, right  Difficulty in walking      Subjective Assessment - 08/04/14 1028    Subjective No pain today , stiffness mostly   Currently in Pain? No/denies            [facility] PT Assessment - 08/04/14 1051    AROM   Right Knee Extension 0   Right Knee Flexion 118   Strength   Right Knee Flexion 4/5   Right Knee Extension 4/5                     [facility] Adult PT Treatment/Exercise - 08/04/14 1032    Knee/Hip Exercises: Aerobic   Stationary Bike L2 6 min   Knee/Hip Exercises: Standing   Heel Raises 20 reps   Knee Flexion Strengthening;Right;20 reps   Knee Flexion Limitations 5 pounds   Lateral Step Up Right;1 set;20 reps;Step Height: 4";Hand Hold: 1   Other Standing Knee Exercises hip abduciton RT and LT x 15 each   Knee/Hip Exercises: Seated   Long Arc Quad Strengthening;Right;2 sets;20 reps   Long Arc Quad  Weight 5 lbs.   Knee/Hip Exercises: Supine   Short Arc Quad Sets AROM;20 reps   Short Arc Quad Sets Limitations 5 pounds                  PT Short Term Goals - 08/04/14 1057    PT SHORT TERM GOAL #1   Title Patient will have right knee flexion to 80 degrees needed for greater ease getting in/out of the car.   Baseline 118   Status Achieved   PT SHORT TERM GOAL #2   Title Patient will have LE strength to manuever her leg on/off the bed and in/out of the car without UE use to compensate.   Status Achieved   PT SHORT TERM GOAL #3   Title Patient will be able to ambulate with or without crutch 300 feet needed for short distance community ambulation.   Status Achieved           PT Long Term Goals - 08/04/14 1058    PT LONG TERM GOAL #1   Title Patient will be independent in safe, self progression for further ROM and strengthening   Status On-going   PT LONG  TERM GOAL #2   Title Right knee flexion to 95 degrees needed for greater ease with going up/down steps at home   Baseline 118   Status Achieved   PT LONG TERM GOAL #3   Title Patient will have 3+/5 quad and HS strength needed for standing 10 min for basic household chores like cooking   Baseline 4+/5, liftign 5 pounds without problem   Status Achieved   PT LONG TERM GOAL #4   Title Gait speed >.79m/sec needed for community ambulation and indicating decreased incidence of adverse events   Status Unable to assess   PT LONG TERM GOAL #5   Title FOTO functional outcome score improved from 76% limitation to 48% indicating improved function with less pain   Baseline Not taken today   Status Unable to assess               Plan - 08/04/14 1057    Clinical Impression Statement Declined cold as she was going to ride baike at home more and would ice after that   PT Next Visit Plan Continue exercise for strength and range, cold   Consulted and Agree with Plan of Care Patient        Problem List Patient Active  Problem List   Diagnosis Date Noted  . Chronic pain syndrome 07/06/2013  . Trochanteric bursitis of left hip 07/06/2013  . Chronic low back pain 07/06/2013  . Arthritis of left knee 04/15/2013  . Diabetes mellitus type II 03/08/2011    Class: Chronic  . Anxiety and depression 03/08/2011    Caprice Red PT 08/04/2014, 10:59 AM  [facility] [address] [place], [place], [address] Phone: [phone]   Fax:  [phone]

## 2014-08-10 ENCOUNTER — Ambulatory Visit: Payer: 59 | Admitting: Physical Therapy

## 2014-08-10 DIAGNOSIS — M6281 Muscle weakness (generalized): Secondary | ICD-10-CM

## 2014-08-10 DIAGNOSIS — R262 Difficulty in walking, not elsewhere classified: Secondary | ICD-10-CM

## 2014-08-10 DIAGNOSIS — M25661 Stiffness of right knee, not elsewhere classified: Secondary | ICD-10-CM

## 2014-08-10 DIAGNOSIS — M25561 Pain in right knee: Secondary | ICD-10-CM | POA: Diagnosis not present

## 2014-08-10 NOTE — Therapy (Addendum)
[place], [place], [address] Phone: [phone]   Fax:  [phone]  Physical Therapy Treatment/Discharge Summary  Patient Details  Name: Madeline Guerrero MRN: [id number] Date of Birth: Sep 01, 1950 Referring Provider:  [name], MD  Encounter Date: 08/10/2014      PT End of Session - 08/10/14 0940    Visit Number 7   Number of Visits 8   Date for PT Re-Evaluation 08/25/14   PT Start Time 0900   PT Stop Time 0948   PT Time Calculation (min) 48 min   Activity Tolerance Patient tolerated treatment well;Patient limited by pain   Behavior During Therapy [facility] for tasks assessed/performed      Past Medical History  Diagnosis Date  . Diabetes mellitus   . Hypertension     Past Surgical History  Procedure Laterality Date  . Cholecystectomy    . Carpal tunnel release    . Neck surgery    . Spine surgery  2007    cervical  . Knee arthroscopy Left     There were no vitals filed for this visit.  Visit Diagnosis:  Right knee pain  Muscle weakness of lower extremity  Difficulty in walking  Joint stiffness of knee, right      Subjective Assessment - 08/10/14 0901    Subjective 15 min late; all week long been in pain; ankle is starting to hurt; went grocery shopping Friday (90 min) which is out of ordinary - hurting since then    Currently in Pain? Yes   Pain Score 5    Pain Location Neck   Pain Orientation Right   Aggravating Factors  prolonged sitting and walking    Pain Relieving Factors ice   Multiple Pain Sites Yes   Pain Location Ankle   Pain Orientation Right;Lateral   Aggravating Factors  steps              [facility] Adult PT Treatment/Exercise - 08/10/14 0908    Knee/Hip Exercises: Seated   Long Arc Quad Strengthening;Right;2 sets;15 reps   Long Arc Quad Weight 5 lbs.   Stool Scoot - Round Trips forward and backwards 55mx 3 each    Other Seated Knee/Hip  Exercises seated heel raises with 5#, 2 x 20    Modalities   Modalities Cryotherapy   Cryotherapy   Number Minutes Cryotherapy 15 Minutes   Cryotherapy Location Knee;Ankle   Type of Cryotherapy Ice pack   Manual Therapy   Manual Therapy Taping   Kinesiotex Inhibit Muscle   Kinesiotix   Inhibit Muscle  lateral ligaments  of ankle; medial knee (meniscus/VMO/patellar tendon)                   PT Short Term Goals - 08/04/14 1057    PT SHORT TERM GOAL #1   Title Patient will have right knee flexion to 80 degrees needed for greater ease getting in/out of the car.   Baseline 118   Status Achieved   PT SHORT TERM GOAL #2   Title Patient will have LE strength to manuever her leg on/off the bed and in/out of the car without UE use to compensate.   Status Achieved   PT SHORT TERM GOAL #3   Title Patient will be able to ambulate with or without crutch 300 feet needed for short distance community ambulation.   Status Achieved           PT Long Term Goals -  08/04/14 1058    PT LONG TERM GOAL #1   Title Patient will be independent in safe, self progression for further ROM and strengthening   Status On-going   PT LONG TERM GOAL #2   Title Right knee flexion to 95 degrees needed for greater ease with going up/down steps at home   Baseline 118   Status Achieved   PT LONG TERM GOAL #3   Title Patient will have 3+/5 quad and HS strength needed for standing 10 min for basic household chores like cooking   Baseline 4+/5, liftign 5 pounds without problem   Status Achieved   PT LONG TERM GOAL #4   Title Gait speed >.58msec needed for community ambulation and indicating decreased incidence of adverse events   Status Unable to assess   PT LONG TERM GOAL #5   Title FOTO functional outcome score improved from 76% limitation to 48% indicating improved function with less pain   Baseline Not taken today   Status Unable to assess               Plan - 08/10/14 0941    Clinical  Impression Statement Madeline Guerrero today with pain her in medial knee with walking - standing exercises aggravated it so seated exercises; taping to ankle and knee; ice at end for pain   PT Next Visit Plan assess taping as of last time; assess gait speed, LTG and FOTO; prepare for DC or renewal; continue with hip and knee strengthening    Consulted and Agree with Plan of Care Patient     During this treatment session, the therapist was present, participating in and directing the treatment.   Problem List Patient Active Problem List   Diagnosis Date Noted  . Chronic pain syndrome 07/06/2013  . Trochanteric bursitis of left hip 07/06/2013  . Chronic low back pain 07/06/2013  . Arthritis of left knee 04/15/2013  . Diabetes mellitus type II 03/08/2011    Class: Chronic  . Anxiety and depression 03/08/2011      Madeline Guerrero 08/10/2014 9:47 AM  Phone: [phone] Fax: [phone] Madeline Guerrero PTA 08/10/2014 11:18 AM Phone: [phone]: [address] [address]Phone: [phone]  Fax:  [phone] PHYSICAL THERAPY DISCHARGE SUMMARY  Visits from Start of Care: 7  Current functional level related to goals / functional outcomes: The patient made some improvement with PT but continues to be in moderate pain with activity.  She did not show for her last scheduled appt and her chart has been inactive for > 1 month.  Will discharge her from PT at this time.    Remaining deficits: Partial goals met.  See above.   Education / Equipment: Initial HEP Plan: Patient agrees to discharge.  Patient goals were not met. Patient is being discharged due to meeting the stated rehab goals.  ?????  SRuben Im PT 09/15/2014 8:41 AM Phone: [phone]: [phone]

## 2014-08-16 ENCOUNTER — Ambulatory Visit: Payer: 59 | Admitting: Physical Therapy

## 2014-08-16 ENCOUNTER — Ambulatory Visit: Payer: 59

## 2014-09-01 ENCOUNTER — Telehealth: Payer: Self-pay | Admitting: Family Medicine

## 2014-09-01 MED ORDER — VARENICLINE TARTRATE 0.5 MG X 11 & 1 MG X 42 PO MISC: ORAL | Status: DC

## 2014-09-01 MED ORDER — VARENICLINE TARTRATE 1 MG PO TABS: 1.0000 mg | ORAL_TABLET | Freq: Two times a day (BID) | ORAL | Status: DC

## 2014-09-01 NOTE — Telephone Encounter (Signed)
Patient calling requesting rx for Chantix; husband going to quit.  Emotionally doing well

## 2015-02-09 ENCOUNTER — Telehealth: Payer: Self-pay | Admitting: Internal Medicine

## 2015-02-09 NOTE — Telephone Encounter (Signed)
Patient Name: Madeline Guerrero DE QMVH Gender: Female DOB: 01/08/1951 Age: 65 Y 6 M 1 D Return Phone Number: Address: City/State/Zip: [place] [facility] Night - Client Client Site [facility] Phys[facility] Carlus Pavlov Contact Type Call Caller Name [name] Caller Phone Number [phone] Relationship To Patient Self Is this call to report lab results? No Call Type General Information Initial Comment caller states she has been trying to get an appt with Dr Elvera Lennox - will someone please call her General Information Type Appointment Nurse Assessment Guidelines Guideline Title Affirmed Question Affirmed Notes Nurse Date/Time Lamount Cohen Time) Disp. Time Lamount Cohen Time) Disposition Final User 02/08/2015 5:18:50 PM General Information Provided Yes [place], Chales Abrahams After Care Instructions Given Call Event Type User Date / Time Description

## 2015-02-09 NOTE — Telephone Encounter (Signed)
???   What should I do with this? C

## 2015-02-10 NOTE — Telephone Encounter (Signed)
absolutely nothing, disregard

## 2015-03-28 ENCOUNTER — Ambulatory Visit (INDEPENDENT_AMBULATORY_CARE_PROVIDER_SITE_OTHER): Payer: BLUE CROSS/BLUE SHIELD | Admitting: Internal Medicine

## 2015-03-28 ENCOUNTER — Telehealth: Payer: Self-pay | Admitting: Internal Medicine

## 2015-03-28 ENCOUNTER — Encounter: Payer: Self-pay | Admitting: Internal Medicine

## 2015-03-28 ENCOUNTER — Other Ambulatory Visit (INDEPENDENT_AMBULATORY_CARE_PROVIDER_SITE_OTHER): Payer: BLUE CROSS/BLUE SHIELD | Admitting: *Deleted

## 2015-03-28 VITALS — BP 110/62 | HR 76 | Temp 98.8°F | Resp 12 | Wt 221.0 lb

## 2015-03-28 DIAGNOSIS — E1165 Type 2 diabetes mellitus with hyperglycemia: Secondary | ICD-10-CM

## 2015-03-28 LAB — POCT GLYCOSYLATED HEMOGLOBIN (HGB A1C): HEMOGLOBIN A1C: 9.9

## 2015-03-28 MED ORDER — BASAGLAR KWIKPEN 100 UNIT/ML ~~LOC~~ SOPN: 14.0000 [IU] | PEN_INJECTOR | Freq: Every day | SUBCUTANEOUS | Status: DC

## 2015-03-28 MED ORDER — GLIPIZIDE ER 5 MG PO TB24: 5.0000 mg | ORAL_TABLET | Freq: Every day | ORAL | Status: DC

## 2015-03-28 MED ORDER — INSULIN PEN NEEDLE 32G X 4 MM MISC
Status: DC
Start: 2015-03-28 — End: 2016-04-28

## 2015-03-28 MED ORDER — GLUCOSE BLOOD VI STRP: ORAL_STRIP | Status: DC

## 2015-03-28 NOTE — Telephone Encounter (Signed)
Pt now states the test strips are not covered by her insurance she does not knowwhat it will cover and the new lantus that was called in isnt covered either

## 2015-03-28 NOTE — Progress Notes (Signed)
Patient ID: Madeline Guerrero, female   DOB: March 27, 1950, 65 y.o.   MRN: [id number]  HPI: Madeline BlankMariolyn C Guerrero is a 65 y.o.-year-old female, initially referred by her PCP, Dr.Lauenstein, for management of DM2, non-insulin-dependent, uncontrolled, without complications. Last visit >2 years ago. She changed insurances >> now [employer].  She needs to find a new PCP as her previous PCP is retiring.   No exercise lately as she has mm and joint pain.  Last hemoglobin A1c was: Lab Results  Component Value Date   HGBA1C 7.9 04/22/2013   HGBA1C 8.5* 02/16/2013   HGBA1C 6.9 08/20/2012   Pt is on a regimen of: - Metformin 1000 mg 2x a day She was Tradjenta 5 mg in am >> lip and tongue swelling She was on Januvia 50 mg daily - 3 mo ago 2/2 insurance not covering it She tried Glyburide >> low CBGs: 40s (2 years ago and again 1 year ago) She tried Actos and stopped b/c of possible bladder CA SE (was on it in 2007) She tried Onglyza >> tongue swelling, SOB >> took Benadril (6 mo ago) She tried Byetta >> sugars decreased too fast (last year)  Pt does not checks her sugars as she ran out of strips. - am: 98-110 >> n/c - 2h after b'fast: n/c - before lunch: n/c - 2h after lunch: if not eating b'fast: 65-68 >> n/c - before dinner: n/c - 2h after dinner: n/c - bedtime: n/c - nighttime: n/c  She has a One Touch Ultra meter  Pt's meals are: - Breakfast: oatmeal - Lunch: leftovers from supper  - Dinner: meat + veggies + starch - Snacks: no She was exercising 3-4 x a week - elliptical. Also, has a bike at home. She would love to restart exercising.  - no CKD, last BUN/creatinine:  Lab Results  Component Value Date   BUN 27* 04/22/2013   CREATININE 0.90 04/22/2013  On Lisinopril. - last set of lipids: Lab Results  Component Value Date   CHOL 178 04/22/2013   HDL 62 04/22/2013   LDLCALC 97 04/22/2013   TRIG 95 04/22/2013   CHOLHDL 2.9 04/22/2013  On Pravachol. - last eye exam  was in 2012. No DR.  - no numbness and tingling in her feet.  Pt has FH of DM in mother, GM, sisters, brother.  ROS: Constitutional: + weight gain,+ fatigue, + hot flushes, + nocturia Eyes: no blurry vision, no xerophthalmia ENT: no sore throat, no nodules palpated in throat, no dysphagia/odynophagia, no hoarseness Cardiovascular: no CP/SOB/palpitations/leg swelling Respiratory: no cough/SOB Gastrointestinal: + N/+ V/+ D/no C Musculoskeletal: no muscle/joint aches Skin: no rashes, + hair loss Neurological: no tremors/numbness/tingling/dizziness  I reviewed pt's medications, allergies, PMH, social hx, family hx, and changes were documented in the history of present illness. Otherwise, unchanged from my initial visit note.  Past Medical History  Diagnosis Date  . Diabetes mellitus   . Hypertension    Past Surgical History  Procedure Laterality Date  . Cholecystectomy    . Carpal tunnel release    . Neck surgery    . Spine surgery  2007    cervical  . Knee arthroscopy Left    History   Social History  . Marital Status: Married    Spouse Name: N/A    Number of Children: 2: 7343 and 64 y/o   Occupational History  . RN   Social History Main Topics  . Smoking status: Current Every Day Smoker -- 0.50 packs/day for 10 years  Types: Cigarettes  . Smokeless tobacco: Not on file  . Alcohol Use: No  . Drug Use: No   Current Outpatient Prescriptions on File Prior to Visit  Medication Sig Dispense Refill  . ALPRAZolam (XANAX) 0.5 MG tablet Take 1 tablet (0.5 mg total) by mouth 3 (three) times daily.  90 tablet  3  . HYDROcodone-acetaminophen (NORCO) 7.5-325 MG per tablet Take 1 tablet by mouth 3 (three) times daily as needed.  90 tablet  0  . lisinopril-hydrochlorothiazide (PRINZIDE,ZESTORETIC) 20-25 MG per tablet Take 1 tablet by mouth daily.  90 tablet  2  . meloxicam (MOBIC) 15 MG tablet Take 1 tablet (15 mg total) by mouth daily. NEEDS OFFICE VISIT  90 tablet  1  .  metFORMIN (GLUCOPHAGE) 1000 MG tablet Take 1 tablet (1,000 mg total) by mouth 2 (two) times daily with a meal.  180 tablet  1  . PARoxetine (PAXIL) 20 MG tablet Take 1 tablet (20 mg total) by mouth daily.  90 tablet  1  . pravastatin (PRAVACHOL) 40 MG tablet Take 1 tablet (40 mg total) by mouth daily.  90 tablet  2  . sitaGLIPtin (JANUVIA) 50 MG tablet Take 1 tablet (50 mg total) by mouth daily. - not taking   90 tablet  3  . [DISCONTINUED] pioglitazone (ACTOS) 30 MG tablet Take 30 mg by mouth daily.         No current facility-administered medications on file prior to visit.   Allergies  Allergen Reactions  . Onglyza [Saxagliptin] Swelling  . Ultram [Tramadol] Nausea And Vomiting  . Levaquin [Levofloxacin] Rash   No family history on file.  PE: BP 110/62 mmHg  Pulse 76  Temp(Src) 98.8 F (37.1 C) (Oral)  Resp 12  Wt 221 lb (100.245 kg)  SpO2 97% Body mass index is 37.92 kg/(m^2). Wt Readings from Last 3 Encounters:  03/28/15 221 lb (100.245 kg)  05/26/14 230 lb (104.327 kg)  07/06/13 216 lb 12.8 oz (98.34 kg)   Constitutional: overweight, in NAD Eyes: PERRLA, EOMI, no exophthalmos ENT: moist mucous membranes, no thyromegaly, no cervical lymphadenopathy Cardiovascular: RRR,+ 1/6 SEM, no RG Respiratory: CTA B Gastrointestinal: abdomen soft, NT, ND, BS+ Musculoskeletal: no deformities, strength intact in all 4 Skin: moist, warm, no rashes Neurological: no tremor with outstretched hands, DTR normal in all 4  ASSESSMENT: 1. DM2, insulin-dependent, uncontrolled, without complications  PLAN:  1. Patient with long-standing, controlled diabetes, on oral antidiabetic regimen with only metformin as she could not afford Tradjenta. She is not checking sugars at home (no strips) but feels poorly, with increased thirst and urination, and N/V/D in last week. - We discussed about options for treatment, and I suggested to start insulin and Glipizide ER - I demonstrated pen use and also  underlined correct inj techniques:  Patient Instructions  Please start Basaglar 14 units at bedtime.  If sugars in the morning are not <150 in 4 days, increase to 18 units.  If sugars in the morning are not <150 in 4 days, increase to 22 units.   When injecting insulin:  Inject in the abdomen  Rotate the injection sites around the belly button  Change needle for each injection  Keep needle in for 6-10 sec after last unit of insulin in  Keep the insulin in use out of the fridge  Continue Metformin 1000 mg 2x a day with meals.  Start Glipizide ER 5 mg in am 15 min before b'fast.  Please let me know if the sugars  are consistently <80 or >200.   Please return in 1-1.5 months with your sugar log.   - Strongly advised her to start checking sugars at different times of the day - check 2x a day - given sugar logs - at next visit >> needs to schedule new eye exam and we need to check BMP, Lipids - refilled test strips - check A1c today >> 9.9% (higher) - Return to clinic in 1-1.5 mo with sugar log   - time spent with the patient: 40 min, of which >50% was spent in obtaining information about her diabetes, reviewing her previous labs and treatments, and developing a plan to further treat it

## 2015-03-28 NOTE — Patient Instructions (Addendum)
Please start Basaglar 14 units at bedtime.  If sugars in the morning are not <150 in 4 days, increase to 18 units.  If sugars in the morning are not <150 in 4 days, increase to 22 units.   When injecting insulin:  Inject in the abdomen  Rotate the injection sites around the belly button  Change needle for each injection  Keep needle in for 6-10 sec after last unit of insulin in  Keep the insulin in use out of the fridge  Continue Metformin 1000 mg 2x a day with meals.  Start Glipizide ER 5 mg in am 15 min before b'fast.  Please let me know if the sugars are consistently <80 or >200.   Please return in 1-1.5 months with your sugar log.

## 2015-03-29 MED ORDER — GLUCOSE BLOOD VI STRP: ORAL_STRIP | Status: DC

## 2015-03-29 MED ORDER — INSULIN DETEMIR 100 UNIT/ML FLEXPEN: 14.0000 [IU] | PEN_INJECTOR | Freq: Every day | SUBCUTANEOUS | Status: DC

## 2015-03-29 MED ORDER — BAYER CONTOUR NEXT MONITOR W/DEVICE KIT: PACK | Status: DC

## 2015-03-29 NOTE — Telephone Encounter (Signed)
He has, let's find out what meter is covered. Alternatively, she can get the relion meter and strips from [employer]. We can try to send Lantus to see if this is covered. However, she may need to call the insurance to see if Lantus or Levemir is covered. If none are, we may need NPH.

## 2015-03-29 NOTE — Telephone Encounter (Signed)
Called pt to advise her of the change. Pt stated that she did not have the insulin yet. She took the glipizide this AM before breakfast. Took metformin with breakfast (oatmeal and a boiled egg). Her blood sugar dropped to 82 at 11:00 am. Please advise if pt should take the insulin and then take the glipizide in the morning as well.

## 2015-03-29 NOTE — Telephone Encounter (Signed)
Please read message below. I can find which meter/strips are covered. Advised on the Basaglar, please.

## 2015-03-29 NOTE — Telephone Encounter (Signed)
Pt's ins covers [employer]. Will send that and Levemir.

## 2015-03-30 NOTE — Telephone Encounter (Signed)
OK. Hold off Levemir but send me sugars on Monday - check 3x a day, rotating checks.

## 2015-03-30 NOTE — Telephone Encounter (Signed)
Called pt and lvm advising her per Dr Charlean SanfilippoGherghe's message. Advised her to call with any questions.

## 2015-04-03 ENCOUNTER — Telehealth: Payer: Self-pay | Admitting: Internal Medicine

## 2015-04-03 NOTE — Telephone Encounter (Signed)
Sugars are improving. I'm not sure what dose of insulin she got up to (I gave her some suggestions about how to increase at last visit). She can increase it by 2-4 units, though.

## 2015-04-03 NOTE — Telephone Encounter (Signed)
Pt calling in with sugar level readings: Thursday: Fasting 245, After Lunch 129, before dinner 118 Friday: Fasting 190, Before Dinner 229 Saturday: Fasting 150, Before Dinner 149 Sunday: Before Lunch 140, Before Bed 136 Monday: Fasting 118

## 2015-04-04 ENCOUNTER — Telehealth: Payer: Self-pay | Admitting: Internal Medicine

## 2015-04-04 NOTE — Telephone Encounter (Signed)
Please review messages from last week. Pt was told to hold off taking (starting) the Levemir. Pt has not started it yet. She called and gave her b/s readings. Does Dr Elvera LennoxGherghe want her to now start the insulin due to her sugar readings. Please advise.

## 2015-04-04 NOTE — Telephone Encounter (Signed)
I see. If she has not started the insulin, since the sugars are better, let's try without the insulin for now and let us know about the sugars in another week.

## 2015-04-04 NOTE — Telephone Encounter (Signed)
Called pt and lvm advising her per Dr Charlean SanfilippoGherghe's message below. Advised pt to call back on Friday with sugar readings.

## 2015-04-04 NOTE — Telephone Encounter (Signed)
PT said she was left a voicemail to stop taking her insulin and monitor her blood sugar but she's never taken insulin before? She was confused by this message.

## 2015-04-05 NOTE — Telephone Encounter (Signed)
Called pt and lvm advising her per Dr Charlean SanfilippoGherghe's message below. Advised pt to call back next week and advise of her b/s readings.

## 2015-05-16 ENCOUNTER — Ambulatory Visit: Payer: BLUE CROSS/BLUE SHIELD | Admitting: Internal Medicine

## 2015-07-14 ENCOUNTER — Ambulatory Visit: Payer: BLUE CROSS/BLUE SHIELD | Admitting: Internal Medicine

## 2015-07-14 DIAGNOSIS — Z0289 Encounter for other administrative examinations: Secondary | ICD-10-CM

## 2015-07-15 ENCOUNTER — Ambulatory Visit (INDEPENDENT_AMBULATORY_CARE_PROVIDER_SITE_OTHER): Payer: BLUE CROSS/BLUE SHIELD | Admitting: Emergency Medicine

## 2015-07-15 VITALS — BP 150/80 | HR 86 | Temp 98.4°F | Resp 18 | Ht 62.0 in | Wt 227.4 lb

## 2015-07-15 DIAGNOSIS — Z1159 Encounter for screening for other viral diseases: Secondary | ICD-10-CM | POA: Diagnosis not present

## 2015-07-15 DIAGNOSIS — I10 Essential (primary) hypertension: Secondary | ICD-10-CM | POA: Diagnosis not present

## 2015-07-15 DIAGNOSIS — Z021 Encounter for pre-employment examination: Secondary | ICD-10-CM | POA: Diagnosis not present

## 2015-07-15 DIAGNOSIS — E119 Type 2 diabetes mellitus without complications: Secondary | ICD-10-CM

## 2015-07-15 DIAGNOSIS — Z23 Encounter for immunization: Secondary | ICD-10-CM | POA: Diagnosis not present

## 2015-07-15 DIAGNOSIS — E785 Hyperlipidemia, unspecified: Secondary | ICD-10-CM | POA: Diagnosis not present

## 2015-07-15 LAB — POCT CBC
GRANULOCYTE PERCENT: 39.4 % (ref 37–80)
HCT, POC: 33.2 % — AB (ref 37.7–47.9)
Hemoglobin: 11.4 g/dL — AB (ref 12.2–16.2)
Lymph, poc: 3.3 (ref 0.6–3.4)
MCH: 32.6 pg — AB (ref 27–31.2)
MCHC: 34.3 g/dL (ref 31.8–35.4)
MCV: 94.9 fL (ref 80–97)
MID (CBC): 0.4 (ref 0–0.9)
MPV: 8.5 fL (ref 0–99.8)
PLATELET COUNT, POC: 214 10*3/uL (ref 142–424)
POC Granulocyte: 2.4 (ref 2–6.9)
POC LYMPH PERCENT: 54 %L — AB (ref 10–50)
POC MID %: 6.6 %M (ref 0–12)
RBC: 3.49 M/uL — AB (ref 4.04–5.48)
RDW, POC: 13.5 %
WBC: 6.2 10*3/uL (ref 4.6–10.2)

## 2015-07-15 LAB — POCT GLYCOSYLATED HEMOGLOBIN (HGB A1C): Hemoglobin A1C: 9

## 2015-07-15 LAB — GLUCOSE, POCT (MANUAL RESULT ENTRY): POC Glucose: 261 mg/dl — AB (ref 70–99)

## 2015-07-15 MED ORDER — LISINOPRIL-HYDROCHLOROTHIAZIDE 20-25 MG PO TABS: 1.0000 | ORAL_TABLET | Freq: Every day | ORAL | Status: DC

## 2015-07-15 MED ORDER — METFORMIN HCL 1000 MG PO TABS: 1000.0000 mg | ORAL_TABLET | Freq: Two times a day (BID) | ORAL | Status: AC

## 2015-07-15 MED ORDER — PNEUMOCOCCAL 13-VAL CONJ VACC IM SUSP: 0.5000 mL | INTRAMUSCULAR | Status: DC

## 2015-07-15 MED ORDER — GLIPIZIDE ER 10 MG PO TB24: 10.0000 mg | ORAL_TABLET | Freq: Every day | ORAL | Status: DC

## 2015-07-15 MED ORDER — PRAVASTATIN SODIUM 20 MG PO TABS: ORAL_TABLET | ORAL | Status: AC

## 2015-07-15 NOTE — Patient Instructions (Addendum)
Please contact her endocrinologist regarding use of insulin. Please schedule your mammogram. Please schedule an appointment to see the eye doctor. Please schedule your colonoscopy.    IF you received an x-ray today, you will receive an invoice from [facility]. Please contact [facility] at [phone] with questions or concerns regarding your invoice.   IF you received labwork today, you will receive an invoice from [facility]. Please contact [facility] at [phone] with questions or concerns regarding your invoice.   Our billing staff will not be able to assist you with questions regarding bills from these companies.  You will be contacted with the lab results as soon as they are available. The fastest way to get your results is to activate your My Chart account. Instructions are located on the last page of this paperwork. If you have not heard from us regarding the results in 2 weeks, please contact this office.

## 2015-07-15 NOTE — Progress Notes (Addendum)
By signing my name below, I, Judithe Modest, attest that this documentation has been prepared under the direction and in the presence of Nena Jordan, MD. Electronically Signed: Judithe Modest, ER Scribe. 07/15/2015. 3:06 PM.  Chief Complaint:  Chief Complaint  Patient presents with  . work clearance    Needs note for new job   HPI: Madeline Guerrero is a 65 y.o. female who reports to [place] today for a pre-employment physical. 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 She has a past hx of DM and HTN. She is not UTD on coloscopy and mammogram. She is due to have her checkup with her eye doctor. She is not taking levemir or pravastatin. She has not had her cholesterol checked recently.   Past Medical History  Diagnosis Date  . Diabetes mellitus   . Hypertension    Past Surgical History  Procedure Laterality Date  . Cholecystectomy    . Carpal tunnel release    . Neck surgery    . Spine surgery  2007    cervical  . Knee arthroscopy Left    Social History   Social History  . Marital Status: Married    Spouse Name: N/A  . Number of Children: N/A  . Years of Education: N/A   Social History Main Topics  . Smoking status: Current Some Day Smoker -- 0.50 packs/day for 10 years    Types: Cigarettes  . Smokeless tobacco: Never Used  . Alcohol Use: No  . Drug Use: No  . Sexual Activity: Not Asked   Other Topics Concern  . None   Social History Narrative   No family history on file. Allergies  Allergen Reactions  . Onglyza [Saxagliptin] Swelling  . Tradjenta [Linagliptin] Swelling    Tongue and lips  . Ultram [Tramadol] Nausea And Vomiting  . Levaquin [Levofloxacin] Rash   Prior to Admission medications   Medication Sig Start Date End Date Taking? Authorizing Provider  Blood Glucose Monitoring Suppl (BAYER CONTOUR NEXT MONITOR) w/Device KIT Use to test blood sugar daily. Dx:E11.65 03/29/15  Yes Philemon Kingdom, MD  diclofenac sodium  (VOLTAREN) 1 % GEL Apply 2 g topically 4 (four) times daily. 07/06/13  Yes Charlett Blake, MD  glipiZIDE (GLUCOTROL XL) 10 MG 24 hr tablet Take 10 mg by mouth daily with breakfast.   Yes Historical Provider, MD  glucose blood (BAYER CONTOUR NEXT TEST) test strip Use to test blood sugar 2 times daily as instructed. Dx: E11.65 03/29/15  Yes Philemon Kingdom, MD  HYDROcodone-acetaminophen (NORCO) 7.5-325 MG per tablet Take 1 tablet by mouth every 8 (eight) hours as needed. 04/22/13  Yes Robyn Haber, MD  Insulin Detemir (LEVEMIR FLEXTOUCH) 100 UNIT/ML Pen Inject 14 Units into the skin at bedtime. 03/29/15  Yes Philemon Kingdom, MD  Insulin Pen Needle (CAREFINE PEN NEEDLES) 32G X 4 MM MISC Use 1x a day 03/28/15  Yes Philemon Kingdom, MD  Lancets MISC Check sugars every day 02/16/13  Yes Philemon Kingdom, MD  lisinopril-hydrochlorothiazide (PRINZIDE,ZESTORETIC) 20-25 MG per tablet Take 1 tablet by mouth daily. 04/22/13  Yes Robyn Haber, MD  metFORMIN (GLUCOPHAGE) 1000 MG tablet Take 1 tablet (1,000 mg total) by mouth 2 (two) times daily with a meal. 04/22/13  Yes Robyn Haber, MD  PARoxetine (PAXIL) 20 MG tablet Take 1 tablet (20 mg total) by mouth daily. 07/30/12  Yes Robyn Haber, MD  pravastatin (PRAVACHOL) 40 MG tablet Take 1 tablet (40 mg total) by  mouth daily. 11/01/12  Yes Robyn Haber, MD     ROS: The patient denies fevers, chills, night sweats, unintentional weight loss, chest pain, palpitations, wheezing, dyspnea on exertion, nausea, vomiting, abdominal pain, dysuria, hematuria, melena, numbness, weakness, or tingling.   All other systems have been reviewed and were otherwise negative with the exception of those mentioned in the HPI and as above.    PHYSICAL EXAM: Filed Vitals:   07/15/15 1436 07/15/15 1452  BP: 158/70 150/80  Pulse: 86   Temp: 98.4 F (36.9 C)   Resp: 18    Body mass index is 41.58 kg/(m^2).   General: Alert, no acute distress HEENT:  Normocephalic,  atraumatic, oropharynx patent. Eye: [facility] Cardiovascular:  Regular rate and rhythm.  No Carotid bruits. Grade one heart murmur, LUSB. Pulses 2+. No peripheral edema. Respiratory: Clear to auscultation bilaterally.  No wheezes, rales, or rhonchi.  No cyanosis, no use of accessory musculature Abdominal: No organomegaly, abdomen is soft and non-tender, positive bowel sounds.  No masses. Musculoskeletal: Gait intact. No edema, tenderness Skin: No rashes. Neurologic: Facial musculature symmetric. Psychiatric: Patient acts appropriately throughout our interaction. Lymphatic: No cervical or submandibular lymphadenopathy   LABS: Results for orders placed or performed in visit on 07/15/15  POCT CBC  Result Value Ref Range   WBC 6.2 4.6 - 10.2 K/uL   Lymph, poc 3.3 0.6 - 3.4   POC LYMPH PERCENT 54.0 (A) 10 - 50 %L   MID (cbc) 0.4 0 - 0.9   POC MID % 6.6 0 - 12 %M   POC Granulocyte 2.4 2 - 6.9   Granulocyte percent 39.4 37 - 80 %G   RBC 3.49 (A) 4.04 - 5.48 M/uL   Hemoglobin 11.4 (A) 12.2 - 16.2 g/dL   HCT, POC 33.2 (A) 37.7 - 47.9 %   MCV 94.9 80 - 97 fL   MCH, POC 32.6 (A) 27 - 31.2 pg   MCHC 34.3 31.8 - 35.4 g/dL   RDW, POC 13.5 %   Platelet Count, POC 214 142 - 424 K/uL   MPV 8.5 0 - 99.8 fL  POCT glycosylated hemoglobin (Hb A1C)  Result Value Ref Range   Hemoglobin A1C 9.0   POCT glucose (manual entry)  Result Value Ref Range   POC Glucose 261 (A) 70 - 99 mg/dl    EKG/XRAY:   Primary read interpreted by Dr. Everlene Farrier at [facility].   ASSESSMENT/PLAN: Diabetes and hypertension not at goal. She agrees that she will make her own appointments to have her mammogram, colonoscopy, and see her eye doctor. I will forward a copy of these results to her endocrinologist Dr.Gherghe. Meds were refilled. I told her she needed to receive instructions ROM the endocrinologist regarding insulin. I did advise her she needed to get back on her pravastatin 20 mg. I also advised her she needed to be on  a baby aspirin daily. She did receive a Prevnar vaccine while in the office.I personally performed the services described in this documentation, which was scribed in my presence. The recorded information has been reviewed and is accurate.   Gross sideeffects, risk and benefits, and alternatives of medications d/w patient. Patient is aware that all medications have potential sideeffects and we are unable to predict every sideeffect or drug-drug interaction that may occur.  Arlyss Queen MD 07/15/2015 3:06 PM

## 2015-07-16 LAB — COMPLETE METABOLIC PANEL WITH GFR
ALBUMIN: 4 g/dL (ref 3.6–5.1)
ALK PHOS: 105 U/L (ref 33–130)
ALT: 18 U/L (ref 6–29)
AST: 19 U/L (ref 10–35)
BUN: 17 mg/dL (ref 7–25)
CALCIUM: 9 mg/dL (ref 8.6–10.4)
CO2: 22 mmol/L (ref 20–31)
Chloride: 105 mmol/L (ref 98–110)
Creat: 0.93 mg/dL (ref 0.50–0.99)
GFR, EST AFRICAN AMERICAN: 76 mL/min (ref >=60)
GFR, Est Non African American: 66 mL/min (ref >=60)
Glucose, Bld: 263 mg/dL — ABNORMAL HIGH (ref 65–99)
POTASSIUM: 4 mmol/L (ref 3.5–5.3)
Sodium: 141 mmol/L (ref 135–146)
Total Bilirubin: 0.3 mg/dL (ref 0.2–1.2)
Total Protein: 6.9 g/dL (ref 6.1–8.1)

## 2015-07-16 LAB — LIPID PANEL
CHOL/HDL RATIO: 2.9 ratio (ref <=5.0)
CHOLESTEROL: 226 mg/dL — AB (ref 125–200)
HDL: 77 mg/dL (ref >=46)
LDL Cholesterol: 126 mg/dL (ref <130)
TRIGLYCERIDES: 113 mg/dL (ref <150)
VLDL: 23 mg/dL (ref <30)

## 2015-07-16 LAB — MICROALBUMIN, URINE: MICROALB UR: 1.4 mg/dL

## 2015-07-16 LAB — HEPATITIS C ANTIBODY: HCV Ab: NEGATIVE

## 2015-10-27 ENCOUNTER — Ambulatory Visit (INDEPENDENT_AMBULATORY_CARE_PROVIDER_SITE_OTHER): Payer: Commercial Managed Care - PPO | Admitting: Physician Assistant

## 2015-10-27 ENCOUNTER — Ambulatory Visit: Payer: Commercial Managed Care - PPO

## 2015-10-27 VITALS — BP 126/82 | HR 70 | Temp 98.2°F | Resp 18 | Ht 62.0 in | Wt 222.0 lb

## 2015-10-27 DIAGNOSIS — Z111 Encounter for screening for respiratory tuberculosis: Secondary | ICD-10-CM | POA: Diagnosis not present

## 2015-10-27 DIAGNOSIS — Z23 Encounter for immunization: Secondary | ICD-10-CM | POA: Diagnosis not present

## 2015-10-27 MED ORDER — GLIPIZIDE ER 10 MG PO TB24: 10.0000 mg | ORAL_TABLET | Freq: Every day | ORAL | 0 refills | Status: AC

## 2015-10-27 NOTE — Progress Notes (Signed)
  Tuberculosis Risk Questionnaire  1. No Were you born outside the [place] in one of the following parts of the world: [place], [place], [place], [place] or [place]?    2. No Have you traveled outside the [place] and lived for more than one month in one of the following parts of the world: [place], [place], [place], [place] or [place]?    3. No Do you have a compromised immune system such as from any of the following conditions:HIV/AIDS, organ or bone marrow transplantation, diabetes, immunosuppressive medicines (e.g. Prednisone, Remicaide), leukemia, lymphoma, cancer of the head or neck, gastrectomy or jejunal bypass, end-stage renal disease (on dialysis), or silicosis?     4. Yes.. Travel Nurse  Have you ever or do you plan on working in: a residential care center, a health care facility, a jail or prison or homeless shelter?    5. No Have you ever: injected illegal drugs, used crack cocaine, lived in a homeless shelter  or been in jail or prison?     6. No Have you ever been exposed to anyone with infectious tuberculosis?    Tuberculosis Symptom Questionnaire  Do you currently have any of the following symptoms?  1. No Unexplained cough lasting more than 3 weeks?   2. No Unexplained fever lasting more than 3 weeks.   3. No Night Sweats (sweating that leaves the bedclothes and sheets wet)     4. No Shortness of Breath   5. No Chest Pain   6. No Unintentional weight loss    7. No Unexplained fatigue (very tired for no reason)

## 2015-10-27 NOTE — Patient Instructions (Addendum)
Please return in 48-72 hours. I refilled for 1 additional month.  Please return for recheck. The chest xray appears to have no signs of tuberculosis.    Tuberculin Skin Test WHY AM I HAVING THIS TEST? Tuberculosis (TB) is a bacterial infection caused by Mycobacterium tuberculosis. Most people who are exposed to these bacteria have a strong enough defense (immune) system to prevent the bacteria from causing TB and developing symptoms. Their bodies prevent the germs from being active and making them sick (latent TB infection).  However, if you have TB germs in your body and your immune system is weak, you can develop a TB infection. This can cause symptoms such as:   Night sweats.  Fever.  Weakness.  Weight loss. A latent TB infection can also become active later in life if your immune system becomes weakened or compromised. You may have this test if your health care provider suspects that you have TB. You may also have this test to screen for TB if you are at risk for getting the disease. Those at increased risk include:  People who inject illegal drugs or share needles.  People with HIV or other diseases that affect immunity.  Health care workers.  People who live in high-risk communities, such as homeless shelters, nursing homes, and correctional facilities.  People who have been in contact with someone with TB.  People from countries where TB is more common. If you are in a high-risk group, your health care provider may wish to screen for TB more often. This can help prevent the spread of the disease. Sometimes TB screening is required when starting a new job, such as becoming a Scientist, forensic or a Runner, broadcasting/film/video. Colleges or universities may require it of new students. HOW WILL I BE TESTED? A tuberculin skin test is the main test used to check for exposure to the bacteria that can cause TB. The test checks for antibodies to the bacteria. Antibodies are proteins that your body  produces to protect you from germs and other things that can make you sick. Your health care provider will inject a solution known as PPD (purified protein derivative) under the first layer of skin on your arm. This causes a blister-like bubble to form at the site. Your health care provider will then examine the site after a number of hours have passed to see if a reaction has occurred. HOW DO I PREPARE FOR THE TEST? There is no preparation required for this test. WHAT DO THE RESULTS MEAN? Your test results will be reported as either negative or positive.  If the tuberculin skin test produces a negative result, it is likely that you do not have TB and have not been exposed to the TB bacteria. If you or your health care provider suspects exposure, however, you may want to repeat the test a few weeks later. A blood test may also be used to check for TB. This is because you will not react to the tuberculin skin test until several weeks after exposure to TB bacteria. If you test positive to the tuberculin skin test, it is likely that you have been exposed to TB bacteria. The test does not distinguish between an active and a latent TB infection. A false-positive result can occur. A false-positive result for TB bacteria is incorrect because it indicates a condition or finding is present when it is not. Talk to your health care provider to discuss your results, treatment options, and if necessary, the need for more  tests. It is your responsibility to obtain your test results. Ask the lab or department performing the test when and how you will get your results. Talk with your health care provider if you have any questions about your results.   This information is not intended to replace advice given to you by your health care provider. Make sure you discuss any questions you have with your health care provider.   Document Released: 10/10/2004 Document Revised: 01/21/2014 Document Reviewed:  04/26/2013 Elsevier Interactive Patient Education 2016 [employer]    IF you received an x-ray today, you will receive an invoice from [facility]. Please contact [facility] at [phone] with questions or concerns regarding your invoice.   IF you received labwork today, you will receive an invoice from [facility]. Please contact [facility] at [phone] with questions or concerns regarding your invoice.   Our billing staff will not be able to assist you with questions regarding bills from these companies.  You will be contacted with the lab results as soon as they are available. The fastest way to get your results is to activate your My Chart account. Instructions are located on the last page of this paperwork. If you have not heard from us regarding the results in 2 weeks, please contact this office.

## 2015-10-27 NOTE — Progress Notes (Addendum)
[facility] [address], [place] [address] [phone]  By signing my name below, I, Madeline Guerrero, attest that this documentation has been prepared under the direction and in the presence of [place], PA-C. Electronically Signed: Verlee Monte, Medical Scribe. 10/27/15. 4:17 PM.  Date:  10/27/2015   Name:  Madeline Guerrero   DOB:  May 14, 1950   MRN:  [id number]  PCP:  [name], MD   Chief Complaint  Patient presents with   Positive PPD    CHEST XRAY   Flu Vaccine   Medication Refill    GLIPIZIDE    History of Present Illness:  Madeline Guerrero is a 65 y.o. female patient who presents to [facility] for positive quantiferon gold test for work. Pt mentions she didn't have any skin color changes when she had the skin test done, and had the quant gold to expedite her transfer of work sites in [place]. 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 Pt takes vicoden for her chronic pain, paxil, lipitor, and glipizide. Pt suspects it was a false positive because she doesn't have any symptoms such as cough, hemoptysis, night sweats, diaphoresis, and unexpected weight loss.  Immunizations: Pt would like to get her flu shot today. traveled outside the [place] and lived for more than one month in one of the following parts of the world: [place], [place], [place], [place] or [place]?               3. No Do you have a compromised immune system such as from any of the following conditions:HIV/AIDS, organ or bone marrow transplantation, diabetes, immunosuppressive medicines (e.g. Prednisone, Remicaide), leukemia, lymphoma, cancer of the head or neck, gastrectomy or jejunal bypass, end-stage renal disease (on dialysis), or silicosis?                           4. Yes.. Travel Nurse  Have you ever or do you plan on working in: a residential care center, a health care facility, a jail or prison or  homeless shelter?               5. No Have you ever: injected illegal drugs, used crack cocaine, lived in a homeless shelter  or been in jail or prison?                6. No Have you ever been exposed to anyone with infectious tuberculosis?               Tuberculosis Symptom Questionnaire  Do you currently have any of the following symptoms?  1. No Unexplained cough lasting more than 3 weeks?   2. No Unexplained fever lasting more than 3 weeks.   3. No Night Sweats (sweating that leaves the bedclothes and sheets wet)                4. No Shortness of Breath   5. No Chest Pain   6. No Unintentional weight loss    7. No Unexplained fatigue (very tired for no reason)  Immunization History  Administered Date(s) Administered   Pneumococcal Conjugate-13 07/15/2015    Patient Active Problem List   Diagnosis Date Noted   Uncontrolled type 2 diabetes mellitus with hyperglycemia, without long-term current use of insulin ([place]) 03/28/2015   Chronic pain syndrome 07/06/2013   Trochanteric bursitis of left hip 07/06/2013   Chronic  low back pain 07/06/2013   Arthritis of left knee 04/15/2013   Anxiety and depression 03/08/2011    Past Medical History:  Diagnosis Date   Diabetes mellitus    Hypertension     Past Surgical History:  Procedure Laterality Date   CARPAL TUNNEL RELEASE     CHOLECYSTECTOMY     KNEE ARTHROSCOPY Left    NECK SURGERY     SPINE SURGERY  2007   cervical    Social History  Substance Use Topics   Smoking status: Current Some Day Smoker    Packs/day: 0.50    Years: 10.00    Types: Cigarettes   Smokeless tobacco: Never Used   Alcohol use No    History reviewed. No pertinent family history.  Allergies  Allergen Reactions   Onglyza [Saxagliptin] Swelling   Tradjenta [Linagliptin] Swelling    Tongue and lips   Ultram [Tramadol] Nausea And Vomiting   Levaquin [Levofloxacin] Rash    Medication list has been  reviewed and updated.  Current Outpatient Prescriptions on File Prior to Visit  Medication Sig Dispense Refill   Blood Glucose Monitoring Suppl (BAYER CONTOUR NEXT MONITOR) w/Device KIT Use to test blood sugar daily. Dx:E11.65 1 kit 0   diclofenac sodium (VOLTAREN) 1 % GEL Apply 2 g topically 4 (four) times daily. 3 Tube 3   glipiZIDE (GLUCOTROL XL) 10 MG 24 hr tablet Take 1 tablet (10 mg total) by mouth daily with breakfast. 90 tablet 3   glucose blood (BAYER CONTOUR NEXT TEST) test strip Use to test blood sugar 2 times daily as instructed. Dx: E11.65 100 each 5   HYDROcodone-acetaminophen (NORCO) 7.5-325 MG per tablet Take 1 tablet by mouth every 8 (eight) hours as needed. 90 tablet 0   Lancets MISC Check sugars every day 100 each 6   lisinopril-hydrochlorothiazide (PRINZIDE,ZESTORETIC) 20-25 MG tablet Take 1 tablet by mouth daily. 90 tablet 3   metFORMIN (GLUCOPHAGE) 1000 MG tablet Take 1 tablet (1,000 mg total) by mouth 2 (two) times daily with a meal. 180 tablet 3   PARoxetine (PAXIL) 20 MG tablet Take 1 tablet (20 mg total) by mouth daily. 90 tablet 1   pravastatin (PRAVACHOL) 20 MG tablet Take 1 tablet daily 90 tablet 3   Insulin Detemir (LEVEMIR FLEXTOUCH) 100 UNIT/ML Pen Inject 14 Units into the skin at bedtime. (Patient not taking: Reported on 10/27/2015) 15 mL 1   Insulin Pen Needle (CAREFINE PEN NEEDLES) 32G X 4 MM MISC Use 1x a day (Patient not taking: Reported on 10/27/2015) 100 each 11   [DISCONTINUED] pioglitazone (ACTOS) 30 MG tablet Take 30 mg by mouth daily.       Current Facility-Administered Medications on File Prior to Visit  Medication Dose Route Frequency Provider Last Rate Last Dose   pneumococcal 13-valent conjugate vaccine (PREVNAR 13) injection 0.5 mL  0.5 mL Intramuscular Tomorrow-1000 Darlyne Russian, MD        Review of Systems  Constitutional: Negative for diaphoresis and weight loss.  Respiratory: Negative for cough and hemoptysis.     Physical  Examination: BP 126/82 (BP Location: Right Arm, Patient Position: Sitting, Cuff Size: Large)    Pulse 70    Temp 98.2 F (36.8 C) (Oral)    Resp 18    Ht 5' 2"  (1.575 m)    Wt 222 lb (100.7 kg)    SpO2 100%    BMI 40.60 kg/m  Ideal Body Weight: @FLOWAMB ([phone])@  Physical Exam  Constitutional: She is oriented to person,  place, and time. She appears well-developed and well-nourished. No distress.  HENT:  Head: Normocephalic and atraumatic.  Right Ear: External ear normal.  Left Ear: External ear normal.  Eyes: Conjunctivae and EOM are normal. Pupils are equal, round, and reactive to light.  Cardiovascular: Normal rate.   Pulmonary/Chest: Effort normal. No respiratory distress.  Neurological: She is alert and oriented to person, place, and time.  Skin: She is not diaphoretic.  Psychiatric: She has a normal mood and affect. Her behavior is normal.   Dg Chest 1 View  Result Date: 10/27/2015 CLINICAL DATA:  Screening examination for pulmonary tuberculosis. EXAM: CHEST 1 VIEW COMPARISON:  Single-view chest 10/27/2015 FINDINGS: Lateral view demonstrates that the calcifications seen on the frontal view are most likely in the mediastinum rather than aortic. Mild degenerative changes in the thoracic spine. No large pleural effusions. IMPRESSION: Evidence for mediastinal calcifications. This finding is suggestive for old granulomatous disease. Electronically Signed   By: Markus Daft M.D.   On: 10/27/2015 17:14   Dg Chest 1 View  Result Date: 10/27/2015 CLINICAL DATA:  Screening study for pulmonary tuberculosis. EXAM: CHEST 1 VIEW COMPARISON:  None. FINDINGS: Heart size is normal. Mediastinal shadows are normal. Peripheral lung parenchyma is clear. Projected over the aortic arch is of focal calcification which could be vascular, could be nodal or could be in the medial left upper lobe. Two-view chest radiography suggested when able. This does not look like active tuberculosis but could be evidence  of old granulomatous infection. IMPRESSION: Calcification projected over the aortic arch. Though this is probably vascular, the possibility of a calcified lymph node or calcified granuloma does exist. Two-view radiography recommended when able. Electronically Signed   By: Nelson Chimes M.D.   On: 10/27/2015 16:43   Assessment and Plan: Karnisha Lefebre Guerrero is a 65 y.o. female who is here today for cc of ppd screening for work  Need for prophylactic vaccination and inoculation against influenza - Plan: Flu Vaccine QUAD 36+ mos IM  PPD screening test - Plan: PPD, DG Chest 1 View, CANCELED: DG Chest 2 View  Screening examination for pulmonary tuberculosis - Plan: PPD, DG Chest 1 View, DG Chest 1 View, CANCELED: DG Chest 2 View  Ivar Drape, PA-C [facility] 10/18/20173:35 PM I personally performed the services described in this documentation, which was scribed in my presence. The recorded information has been reviewed and is accurate.

## 2015-10-30 ENCOUNTER — Ambulatory Visit (INDEPENDENT_AMBULATORY_CARE_PROVIDER_SITE_OTHER): Payer: Commercial Managed Care - PPO | Admitting: Physician Assistant

## 2015-10-30 DIAGNOSIS — Z111 Encounter for screening for respiratory tuberculosis: Secondary | ICD-10-CM

## 2015-10-30 LAB — TB SKIN TEST
Induration: 0 mm
TB SKIN TEST: NEGATIVE

## 2015-10-30 NOTE — Progress Notes (Signed)
PATIENT PRESENTED TODAY FOR PPD READING ONLY. JP/CMA  Results for orders placed or performed in visit on 10/27/15  PPD  Result Value Ref Range   TB Skin Test Negative    Induration 0 mm

## 2015-11-11 ENCOUNTER — Encounter (HOSPITAL_COMMUNITY): Payer: Self-pay | Admitting: *Deleted

## 2015-11-11 ENCOUNTER — Emergency Department (HOSPITAL_COMMUNITY)
Admission: EM | Admit: 2015-11-11 | Discharge: 2015-11-11 | Disposition: A | Discharge status: Home / Self Care | Payer: No Typology Code available for payment source | Attending: Emergency Medicine | Admitting: Emergency Medicine

## 2015-11-11 ENCOUNTER — Emergency Department (HOSPITAL_COMMUNITY): Payer: No Typology Code available for payment source

## 2015-11-11 DIAGNOSIS — I10 Essential (primary) hypertension: Secondary | ICD-10-CM | POA: Diagnosis not present

## 2015-11-11 DIAGNOSIS — Z794 Long term (current) use of insulin: Secondary | ICD-10-CM | POA: Diagnosis not present

## 2015-11-11 DIAGNOSIS — Z79899 Other long term (current) drug therapy: Secondary | ICD-10-CM | POA: Diagnosis not present

## 2015-11-11 DIAGNOSIS — M5416 Radiculopathy, lumbar region: Secondary | ICD-10-CM | POA: Insufficient documentation

## 2015-11-11 DIAGNOSIS — Y999 Unspecified external cause status: Secondary | ICD-10-CM | POA: Diagnosis not present

## 2015-11-11 DIAGNOSIS — F17210 Nicotine dependence, cigarettes, uncomplicated: Secondary | ICD-10-CM | POA: Diagnosis not present

## 2015-11-11 DIAGNOSIS — Y939 Activity, unspecified: Secondary | ICD-10-CM | POA: Insufficient documentation

## 2015-11-11 DIAGNOSIS — E119 Type 2 diabetes mellitus without complications: Secondary | ICD-10-CM | POA: Diagnosis not present

## 2015-11-11 DIAGNOSIS — M545 Low back pain: Secondary | ICD-10-CM | POA: Diagnosis present

## 2015-11-11 DIAGNOSIS — Z7984 Long term (current) use of oral hypoglycemic drugs: Secondary | ICD-10-CM | POA: Diagnosis not present

## 2015-11-11 DIAGNOSIS — M25561 Pain in right knee: Secondary | ICD-10-CM | POA: Insufficient documentation

## 2015-11-11 DIAGNOSIS — Y92410 Unspecified street and highway as the place of occurrence of the external cause: Secondary | ICD-10-CM | POA: Diagnosis not present

## 2015-11-11 MED ORDER — HYDROCODONE-ACETAMINOPHEN 5-325 MG PO TABS: 1.0000 | ORAL_TABLET | Freq: Four times a day (QID) | ORAL | 0 refills | Status: DC | PRN

## 2015-11-11 MED ORDER — IBUPROFEN 600 MG PO TABS: 600.0000 mg | ORAL_TABLET | Freq: Four times a day (QID) | ORAL | 0 refills | Status: DC | PRN
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
## 2015-11-11 NOTE — ED Notes (Signed)
Pt to xray

## 2015-11-30 ENCOUNTER — Ambulatory Visit (INDEPENDENT_AMBULATORY_CARE_PROVIDER_SITE_OTHER): Payer: Self-pay

## 2015-11-30 ENCOUNTER — Ambulatory Visit (INDEPENDENT_AMBULATORY_CARE_PROVIDER_SITE_OTHER): Payer: Self-pay | Admitting: Orthopaedic Surgery

## 2015-11-30 DIAGNOSIS — M542 Cervicalgia: Secondary | ICD-10-CM

## 2015-11-30 DIAGNOSIS — M5441 Lumbago with sciatica, right side: Secondary | ICD-10-CM

## 2015-11-30 MED ORDER — METHOCARBAMOL 500 MG PO TABS: 500.0000 mg | ORAL_TABLET | Freq: Three times a day (TID) | ORAL | 0 refills | Status: DC | PRN

## 2015-11-30 MED ORDER — HYDROCODONE-ACETAMINOPHEN 5-325 MG PO TABS: 1.0000 | ORAL_TABLET | Freq: Four times a day (QID) | ORAL | 0 refills | Status: DC | PRN

## 2015-11-30 MED ORDER — GABAPENTIN 300 MG PO CAPS: 300.0000 mg | ORAL_CAPSULE | Freq: Every day | ORAL | 0 refills | Status: DC
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
## 2015-12-06 ENCOUNTER — Telehealth (INDEPENDENT_AMBULATORY_CARE_PROVIDER_SITE_OTHER): Payer: Self-pay | Admitting: *Deleted

## 2015-12-06 ENCOUNTER — Ambulatory Visit: Payer: Self-pay | Attending: Orthopaedic Surgery | Admitting: Physical Therapy

## 2015-12-06 DIAGNOSIS — M542 Cervicalgia: Secondary | ICD-10-CM | POA: Insufficient documentation

## 2015-12-06 DIAGNOSIS — R262 Difficulty in walking, not elsewhere classified: Secondary | ICD-10-CM | POA: Insufficient documentation

## 2015-12-06 DIAGNOSIS — M6281 Muscle weakness (generalized): Secondary | ICD-10-CM | POA: Insufficient documentation

## 2015-12-06 DIAGNOSIS — M25661 Stiffness of right knee, not elsewhere classified: Secondary | ICD-10-CM | POA: Insufficient documentation

## 2015-12-06 DIAGNOSIS — G8929 Other chronic pain: Secondary | ICD-10-CM | POA: Insufficient documentation

## 2015-12-06 DIAGNOSIS — M25561 Pain in right knee: Secondary | ICD-10-CM | POA: Insufficient documentation

## 2015-12-06 DIAGNOSIS — M5441 Lumbago with sciatica, right side: Secondary | ICD-10-CM | POA: Insufficient documentation

## 2015-12-06 DIAGNOSIS — M5416 Radiculopathy, lumbar region: Secondary | ICD-10-CM | POA: Insufficient documentation

## 2015-12-06 NOTE — Telephone Encounter (Signed)
Out pt rehab called and needs referral for neck physical therapy.Pt is there now and needs this to be seen. Fax is [phone]

## 2015-12-06 NOTE — Telephone Encounter (Signed)
faxed
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
## 2015-12-06 NOTE — Telephone Encounter (Signed)
Please advise 

## 2015-12-06 NOTE — Telephone Encounter (Signed)
See script to fax. thanks

## 2015-12-12 ENCOUNTER — Ambulatory Visit: Payer: Self-pay | Admitting: Physical Therapy

## 2015-12-12 DIAGNOSIS — M5441 Lumbago with sciatica, right side: Secondary | ICD-10-CM

## 2015-12-12 DIAGNOSIS — M25561 Pain in right knee: Secondary | ICD-10-CM

## 2015-12-12 DIAGNOSIS — M25661 Stiffness of right knee, not elsewhere classified: Secondary | ICD-10-CM

## 2015-12-12 DIAGNOSIS — M5416 Radiculopathy, lumbar region: Secondary | ICD-10-CM

## 2015-12-12 DIAGNOSIS — M542 Cervicalgia: Secondary | ICD-10-CM

## 2015-12-12 DIAGNOSIS — G8929 Other chronic pain: Secondary | ICD-10-CM

## 2015-12-12 DIAGNOSIS — R262 Difficulty in walking, not elsewhere classified: Secondary | ICD-10-CM

## 2015-12-12 DIAGNOSIS — M6281 Muscle weakness (generalized): Secondary | ICD-10-CM

## 2015-12-12 NOTE — Therapy (Signed)
[facility] [address] [place], [place], [address] Phone: [phone]   Fax:  [phone]  Physical Therapy Treatment  Patient Details  Name: Madeline LemmingsMariolyn C Guerrero MRN: [id number] Date of Birth: 1950-05-15 Referring Provider: Dr. Doneen Poissonhristopher Blackman  Encounter Date: 12/12/2015      PT End of Session - 12/12/15 0810    Visit Number 2   Number of Visits 16   Date for PT Re-Evaluation 01/31/16   PT Start Time 0732   PT Stop Time 0817   PT Time Calculation (min) 45 min   Activity Tolerance Patient tolerated treatment well   Behavior During Therapy [facility] for tasks assessed/performed      Past Medical History:  Diagnosis Date  . Diabetes mellitus   . Hypertension     Past Surgical History:  Procedure Laterality Date  . CARPAL TUNNEL RELEASE    . CHOLECYSTECTOMY    . KNEE ARTHROSCOPY Left   . NECK SURGERY    . SPINE SURGERY  2007   cervical    There were no vitals filed for this visit.      Subjective Assessment - 12/12/15 0735    Subjective My Pain meds have not kicked in.   Currently in Pain? Yes   Pain Score 7    Pain Location Back   Pain Orientation Lower   Pain Descriptors / Indicators Aching;Shooting;Numbness   Pain Radiating Towards right thigh to knee   Pain Frequency Constant   Aggravating Factors  standing, sitting too long   Pain Relieving Factors Pain meds. leaning against wall when passinf meds   Pain Score 3   Pain Location Neck   Pain Orientation Right;Lateral   Pain Descriptors / Indicators Aching  pain   Pain Type Acute pain   Pain Onset 1 to 4 weeks ago   Pain Frequency Intermittent   Aggravating Factors  turning head, reading   Pain Relieving Factors rest                         [facility] Adult PT Treatment/Exercise - 12/12/15 0001      Self-Care   Self-Care --  ADL demo a few modifications     Lumbar Exercises: Stretches   Piriformis Stretch 3 reps;30  seconds  HEP sitting and supine, 2 positions     Lumbar Exercises: Supine   Ab Set --  10 X with breathing   Clam 5 reps   Clam Limitations painful, unable to control pelvis     Moist Heat Therapy   Number Minutes Moist Heat 15 Minutes   Moist Heat Location Cervical;Lumbar Spine                PT Education - 12/12/15 0800    Education provided Yes   Education Details exercise and ADL   Person(s) Educated Patient   Methods Explanation;Demonstration;Verbal cues;Handout   Comprehension Verbalized understanding;Returned demonstration;Need further instruction          PT Short Term Goals - 12/12/15 0919      PT SHORT TERM GOAL #1   Title Pt will be I with initial HEP for back, neck.    Baseline initial exercises issued today   Time 4   Period Weeks   Status On-going     PT SHORT TERM GOAL #2   Title Pt will be able to work with less pain in back with work duties (improved 20-25%)   Baseline No change  Time 4   Period Weeks   Status On-going     PT SHORT TERM GOAL #3   Title Pt will understand and demo good body mechanics, lifting when cued in clinic.    Baseline Educatrion initiated today   Time 4   Period Weeks   Status On-going     PT SHORT TERM GOAL #4   Title Balance Screen complete and goal set.    Time 4   Status Unable to assess           PT Long Term Goals - 12/06/15 1247      PT LONG TERM GOAL #1   Title Patient will be independent in safe HEP , self progression for further ROM and strengthening   Time 8   Period Weeks   Status New     PT LONG TERM GOAL #2   Title Pt will be able to walk, squat for work duties without increasing back pain.    Time 4   Period Weeks   Status New     PT LONG TERM GOAL #3   Title Patient will be able to stand and cook for 20-30 min without increase in neck pain    Time 8   Status New     PT LONG TERM GOAL #4   Title Pt will improve balance (goal to be set)    Time 8   Period Weeks   Status New      PT LONG TERM GOAL #5   Title Pt will be able to turn head to drive without increase in neck pain    Time 8   Period Weeks   Status New     PT LONG TERM GOAL #6   Title FOTO score will improve to less than 40% limited in order to demo functional improvement.    Time 8   Period Weeks   Status New               Plan - 12/12/15 16100811    Clinical Impression Statement Pain decreased to 4/10,  Progress toward HEP and ADL goals.  Patient has poor core control with exercise.    PT Next Visit Plan  Continue HEP for neck and lumbar/trunk.  Modalities for pain.        Patient will benefit from skilled therapeutic intervention in order to improve the following deficits and impairments:  Decreased range of motion, Difficulty walking, Increased fascial restricitons, Obesity, Pain, Decreased activity tolerance, Impaired flexibility, Improper body mechanics, Decreased strength, Decreased mobility  Visit Diagnosis: Cervicalgia  Radiculopathy, lumbar region  Acute bilateral low back pain with right-sided sciatica  Chronic pain of right knee  Muscle weakness of lower extremity  Difficulty in walking  Joint stiffness of knee, right     Problem List Patient Active Problem List   Diagnosis Date Noted  . Uncontrolled type 2 diabetes mellitus with hyperglycemia, without long-term current use of insulin (HCC) 03/28/2015  . Chronic pain syndrome 07/06/2013  . Trochanteric bursitis of left hip 07/06/2013  . Chronic low back pain 07/06/2013  . Arthritis of left knee 04/15/2013  . Anxiety and depression 03/08/2011    Johnte Portnoy PTA 12/12/2015, 9:22 AM  [facility] [address] [place], [place], [address] Phone: [phone]   Fax:  [phone]  Name: Madeline LemmingsMariolyn C Guerrero MRN: [id number] Date of Birth: July 31, 1950

## 2015-12-12 NOTE — Patient Instructions (Signed)
Piriformis Stretch    Lying on back, pull right knee toward opposite shoulder. Hold __15__ seconds. Repeat _3-5___ times. Do _as neededPiriformis Stretch, Sitting    Sit, one ankle on opposite knee, same-side hand on crossed knee. Push down on knee, keeping spine straight. Lean torso forward, with flat back, until tension is felt in hamstrings and gluteals of crossed-leg side. Hold _10-15__ seconds.  Repeat _1-5__ times per session. Do __as needed_ sessions per day.  Copyright  VHI. All rights reserved.    http://gt2.exer.us/258   Copyright  VHI. All rights reserved.  (Clinic) Flexion: Pelvic Tilt    Lie with neck supported, knees bent, feet flat. Tighten and suck stomach in, pushing back down against surface. Do not push down with legs. Repeat __5__ times per set. Do _1___ sets per session.   Copyright  VHI. All rights reserved.  Sleeping on Back  Place pillow under knees. A pillow with cervical support and a roll around waist are also helpful. Copyright  VHI. All rights reserved.  Sleeping on Side Place pillow between knees. Use cervical support under neck and a roll around waist as needed. Copyright  VHI. All rights reserved.   Sleeping on Stomach   If this is the only desirable sleeping position, place pillow under lower legs, and under stomach or chest as needed.  Posture - Sitting   Sit upright, head facing forward. Try using a roll to support lower back. Keep shoulders relaxed, and avoid rounded back. Keep hips level with knees. Avoid crossing legs for long periods. Stand to Sit / Sit to Stand   To sit: Bend knees to lower self onto front edge of chair, then scoot back on seat. To stand: Reverse sequence by placing one foot forward, and scoot to front of seat. Use rocking motion to stand up.   Work Height and Reach  Ideal work height is no more than 2 to 4 inches below elbow level when standing, and at elbow level when sitting. Reaching should be limited  to arm's length, with elbows slightly bent.  Bending  Bend at hips and knees, not back. Keep feet shoulder-width apart.    Posture - Standing   Good posture is important. Avoid slouching and forward head thrust. Maintain curve in low back and align ears over shoul- ders, hips over ankles.  Alternating Positions   Alternate tasks and change positions frequently to reduce fatigue and muscle tension. Take rest breaks. Computer Work   Position work to Art gallery managerface forward. Use proper work and seat height. Keep shoulders back and down, wrists straight, and elbows at right angles. Use chair that provides full back support. Add footrest and lumbar roll as needed.  Getting Into / Out of Car  Lower self onto seat, scoot back, then bring in one leg at a time. Reverse sequence to get out.  Dressing  Lie on back to pull socks or slacks over feet, or sit and bend leg while keeping back straight.    Housework - Sink  Place one foot on ledge of cabinet under sink when standing at sink for prolonged periods.   Pushing / Pulling  Pushing is preferable to pulling. Keep back in proper alignment, and use leg muscles to do the work.  Deep Squat   Squat and lift with both arms held against upper trunk. Tighten stomach muscles without holding breath. Use smooth movements to avoid jerking.  Avoid Twisting   Avoid twisting or bending back. Pivot around using foot movements, and bend  at knees if needed when reaching for articles.  Carrying Luggage   Distribute weight evenly on both sides. Use a cart whenever possible. Do not twist trunk. Move body as a unit.   Lifting Principles .Maintain proper posture and head alignment. .Slide object as close as possible before lifting. .Move obstacles out of the way. .Test before lifting; ask for help if too heavy. .Tighten stomach muscles without holding breath. .Use smooth movements; do not jerk. .Use legs to do the work, and pivot with  feet. .Distribute the work load symmetrically and close to the center of trunk. .Push instead of pull whenever possible.   Ask For Help   Ask for help and delegate to others when possible. Coordinate your movements when lifting together, and maintain the low back curve.  Log Roll   Lying on back, bend left knee and place left arm across chest. Roll all in one movement to the right. Reverse to roll to the left. Always move as one unit. Housework - Sweeping  Use long-handled equipment to avoid stooping.   Housework - Wiping  Position yourself as close as possible to reach work surface. Avoid straining your back.  Laundry - Unloading Wash   To unload small items at bottom of washer, lift leg opposite to arm being used to reach.  Gardening - Raking  Move close to area to be raked. Use arm movements to do the work. Keep back straight and avoid twisting.     Cart  When reaching into cart with one arm, lift opposite leg to keep back straight.   Getting Into / Out of Bed  Lower self to lie down on one side by raising legs and lowering head at the same time. Use arms to assist moving without twisting. Bend both knees to roll onto back if desired. To sit up, start from lying on side, and use same move-ments in reverse. Housework - Vacuuming  Hold the vacuum with arm held at side. Step back and forth to move it, keeping head up. Avoid twisting.   Laundry - Armed forces training and education officerLoading Wash  Position laundry basket so that bending and twisting can be avoided.   Laundry - Unloading Dryer  Squat down to reach into clothes dryer or use a reacher.  Gardening - Weeding / Psychiatric nurselanting  Squat or Kneel. Knee pads may be helpful.

## 2015-12-14 ENCOUNTER — Ambulatory Visit: Payer: Self-pay | Admitting: Physical Therapy

## 2015-12-14 DIAGNOSIS — R262 Difficulty in walking, not elsewhere classified: Secondary | ICD-10-CM

## 2015-12-14 DIAGNOSIS — M25561 Pain in right knee: Secondary | ICD-10-CM

## 2015-12-14 DIAGNOSIS — M6281 Muscle weakness (generalized): Secondary | ICD-10-CM

## 2015-12-14 DIAGNOSIS — G8929 Other chronic pain: Secondary | ICD-10-CM

## 2015-12-14 DIAGNOSIS — M5416 Radiculopathy, lumbar region: Secondary | ICD-10-CM

## 2015-12-14 DIAGNOSIS — M542 Cervicalgia: Secondary | ICD-10-CM

## 2015-12-14 DIAGNOSIS — M5441 Lumbago with sciatica, right side: Secondary | ICD-10-CM

## 2015-12-14 NOTE — Therapy (Signed)
[place], [place], [address] Phone: [phone]   Fax:  [phone]  Physical Therapy Treatment  Patient Details  Name: Madeline Guerrero MRN: [id number] Date of Birth: 30-May-1950 Referring Provider: Dr. Jean Rosenthal  Encounter Date: 12/14/2015      PT End of Session - 12/14/15 1306    Visit Number 3   Number of Visits 16   Date for PT Re-Evaluation 01/31/16   PT Start Time 0354   PT Stop Time 0948   PT Time Calculation (min) 53 min   Activity Tolerance Patient tolerated treatment well   Behavior During Therapy [facility] for tasks assessed/performed      Past Medical History:  Diagnosis Date  . Diabetes mellitus   . Hypertension     Past Surgical History:  Procedure Laterality Date  . CARPAL TUNNEL RELEASE    . CHOLECYSTECTOMY    . KNEE ARTHROSCOPY Left   . NECK SURGERY    . SPINE SURGERY  2007   cervical    There were no vitals filed for this visit.      Subjective Assessment - 12/14/15 1301    Subjective Pt feeling increased pain today.  Worse than the other day.    Currently in Pain? Yes   Pain Score 6    Pain Location Back   Pain Score 2   Pain Location Neck             [facility] Adult PT Treatment/Exercise - 12/14/15 1322      Lumbar Exercises: Stretches   Single Knee to Chest Stretch 5 reps;30 seconds   Lower Trunk Rotation 5 reps   Pelvic Tilt 5 reps   Pelvic Tilt Limitations x 10    Standing Extension 3 reps;10 seconds   Quad Stretch 2 reps;30 seconds   Quad Stretch Limitations leg over table   Piriformis Stretch 3 reps;30 seconds  HEP sitting and supine, 2 positions   Piriformis Stretch Limitations 2 ways      Lumbar Exercises: Aerobic   Stationary Bike NuStep LE and UE for 5 min      Moist Heat Therapy   Number Minutes Moist Heat 15 Minutes   Moist Heat Location Cervical;Lumbar Spine     Manual Therapy   Manual Therapy Soft tissue mobilization    Manual therapy comments light to mod pressure    Soft tissue mobilization IASTM along Rt. ant thigh, quads , very painful                PT Education - 12/14/15 1303    Education provided Yes   Education Details stretching technique, Cervical ROM    Person(s) Educated Patient   Methods Explanation;Handout   Comprehension Verbalized understanding;Returned demonstration;Verbal cues required;Tactile cues required          PT Short Term Goals - 12/12/15 0919      PT SHORT TERM GOAL #1   Title Pt will be I with initial HEP for back, neck.    Baseline initial exercises issued today   Time 4   Period Weeks   Status On-going     PT SHORT TERM GOAL #2   Title Pt will be able to work with less pain in back with work duties (improved 20-25%)   Baseline No change   Time 4   Period Weeks   Status On-going     PT SHORT TERM GOAL #3   Title Pt will understand and demo good body  mechanics, lifting when cued in clinic.    Baseline Educatrion initiated today   Time 4   Period Weeks   Status On-going     PT SHORT TERM GOAL #4   Title Balance Screen complete and goal set.    Time 4   Status Unable to assess           PT Long Term Goals - 12/06/15 1247      PT LONG TERM GOAL #1   Title Patient will be independent in safe HEP , self progression for further ROM and strengthening   Time 8   Period Weeks   Status New     PT LONG TERM GOAL #2   Title Pt will be able to walk, squat for work duties without increasing back pain.    Time 4   Period Weeks   Status New     PT LONG TERM GOAL #3   Title Patient will be able to stand and cook for 20-30 min without increase in neck pain    Time 8   Status New     PT LONG TERM GOAL #4   Title Pt will improve balance (goal to be set)    Time 8   Period Weeks   Status New     PT LONG TERM GOAL #5   Title Pt will be able to turn head to drive without increase in neck pain    Time 8   Period Weeks   Status New     PT  LONG TERM GOAL #6   Title FOTO score will improve to less than 40% limited in order to demo functional improvement.    Time 8   Period Weeks   Status New               Plan - 12/14/15 1306    Clinical Impression Statement Pt with significant hip tightness, increase in back pain with exercises.  Cervical rotation improved via observation with stretching. No goals met.    PT Next Visit Plan  Continue HEP for neck and lumbar/trunk.  Modalities for pain.     PT Home Exercise Plan C -AROM, piriformis stretch seated and supine, post pelvic tilts    Consulted and Agree with Plan of Care Patient      Patient will benefit from skilled therapeutic intervention in order to improve the following deficits and impairments:  Decreased range of motion, Difficulty walking, Increased fascial restricitons, Obesity, Pain, Decreased activity tolerance, Impaired flexibility, Improper body mechanics, Decreased strength, Decreased mobility  Visit Diagnosis: Cervicalgia  Radiculopathy, lumbar region  Acute bilateral low back pain with right-sided sciatica  Chronic pain of right knee  Muscle weakness of lower extremity  Difficulty in walking     Problem List Patient Active Problem List   Diagnosis Date Noted  . Uncontrolled type 2 diabetes mellitus with hyperglycemia, without long-term current use of insulin ([place]) 03/28/2015  . Chronic pain syndrome 07/06/2013  . Trochanteric bursitis of left hip 07/06/2013  . Chronic low back pain 07/06/2013  . Arthritis of left knee 04/15/2013  . Anxiety and depression 03/08/2011    Madeline Guerrero 12/14/2015, 1:27 PM  [facility] [address] [place], [place], [address] Phone: [phone]   Fax:  [phone]  Name: Madeline Guerrero MRN: [id number] Date of Birth: 10/12/1950   Raeford Razor, PT 12/14/15 1:28 PM Phone: [phone] Fax: [phone]

## 2015-12-18 ENCOUNTER — Ambulatory Visit: Payer: Self-pay | Admitting: Physical Therapy

## 2015-12-18 ENCOUNTER — Ambulatory Visit: Payer: Self-pay | Attending: Orthopaedic Surgery | Admitting: Physical Therapy

## 2015-12-18 DIAGNOSIS — R262 Difficulty in walking, not elsewhere classified: Secondary | ICD-10-CM | POA: Insufficient documentation

## 2015-12-18 DIAGNOSIS — M542 Cervicalgia: Secondary | ICD-10-CM | POA: Insufficient documentation

## 2015-12-18 DIAGNOSIS — M5416 Radiculopathy, lumbar region: Secondary | ICD-10-CM | POA: Insufficient documentation

## 2015-12-18 DIAGNOSIS — M6281 Muscle weakness (generalized): Secondary | ICD-10-CM | POA: Insufficient documentation

## 2015-12-18 DIAGNOSIS — M25561 Pain in right knee: Secondary | ICD-10-CM | POA: Insufficient documentation

## 2015-12-18 DIAGNOSIS — M5441 Lumbago with sciatica, right side: Secondary | ICD-10-CM | POA: Insufficient documentation

## 2015-12-18 DIAGNOSIS — G8929 Other chronic pain: Secondary | ICD-10-CM | POA: Insufficient documentation

## 2015-12-18 DIAGNOSIS — M25661 Stiffness of right knee, not elsewhere classified: Secondary | ICD-10-CM | POA: Insufficient documentation

## 2015-12-18 NOTE — Therapy (Signed)
[facility] [address] [place], [place], [address] Phone: [phone]   Fax:  [phone]  Physical Therapy Treatment  Patient Details  Name: Madeline Guerrero MRN: [id number] Date of Birth: 07/23/1950 Referring Provider: Dr. Doneen Poisson  Encounter Date: 12/18/2015      PT End of Session - 12/18/15 1234    Visit Number 4   Number of Visits 16   Date for PT Re-Evaluation 01/31/16   PT Start Time 0848   PT Stop Time 0945   PT Time Calculation (min) 57 min   Activity Tolerance Patient tolerated treatment well   Behavior During Therapy [facility] for tasks assessed/performed      Past Medical History:  Diagnosis Date  . Diabetes mellitus   . Hypertension     Past Surgical History:  Procedure Laterality Date  . CARPAL TUNNEL RELEASE    . CHOLECYSTECTOMY    . KNEE ARTHROSCOPY Left   . NECK SURGERY    . SPINE SURGERY  2007   cervical    There were no vitals filed for this visit.      Subjective Assessment - 12/18/15 0851    Subjective A little better today.  Haven't had as many spasms or numbness in that leg.  Pain in back is 4/10.  No neck pain today or yesterday.    Currently in Pain? Yes   Pain Score 4    Pain Location Back   Pain Orientation Lower   Pain Descriptors / Indicators Aching;Sore   Pain Type Chronic pain;Acute pain   Pain Onset More than a month ago   Pain Frequency Constant   Pain Score 0   Pain Location Neck                [facility] Adult PT Treatment/Exercise - 12/18/15 0908      Lumbar Exercises: Stretches   Single Knee to Chest Stretch 2 reps;30 seconds   Lower Trunk Rotation Limitations x 10      Lumbar Exercises: Standing   Wall Slides 20 reps   Other Standing Lumbar Exercises hip abd x 20 each leg      Shoulder Exercises: Therapy Ball   Other Therapy Ball Exercises standing against wall horiz abd yellow band x 10, and overhead lift x 10 yellow band      Moist  Heat Therapy   Number Minutes Moist Heat 15 Minutes   Moist Heat Location Cervical;Lumbar Spine     Manual Therapy   Manual Therapy Soft tissue mobilization   Manual therapy comments light to mod pressure    Soft tissue mobilization IASTM along Rt. ant thigh, quads , very painful   less pain today but still tender      Neck Exercises: Stretches   Upper Trapezius Stretch 2 reps;30 seconds   Levator Stretch 2 reps;30 seconds   Levator Stretch Limitations hand on head lightly   Other Neck Stretches Cervical AROM flexion, ext x 5 each, 15 sec hold    Other Neck Stretches cervical rotation x 3 each  side 15 sec                 PT Education - 12/18/15 1234    Education provided Yes   Education Details core , neck stretching    Person(s) Educated Patient   Methods Explanation   Comprehension Verbalized understanding          PT Short Term Goals - 12/12/15 0919      PT  SHORT TERM GOAL #1   Title Pt will be I with initial HEP for back, neck.    Baseline initial exercises issued today   Time 4   Period Weeks   Status On-going     PT SHORT TERM GOAL #2   Title Pt will be able to work with less pain in back with work duties (improved 20-25%)   Baseline No change   Time 4   Period Weeks   Status On-going     PT SHORT TERM GOAL #3   Title Pt will understand and demo good body mechanics, lifting when cued in clinic.    Baseline Educatrion initiated today   Time 4   Period Weeks   Status On-going     PT SHORT TERM GOAL #4   Title Balance Screen complete and goal set.    Time 4   Status Unable to assess           PT Long Term Goals - 12/06/15 1247      PT LONG TERM GOAL #1   Title Patient will be independent in safe HEP , self progression for further ROM and strengthening   Time 8   Period Weeks   Status New     PT LONG TERM GOAL #2   Title Pt will be able to walk, squat for work duties without increasing back pain.    Time 4   Period Weeks   Status  New     PT LONG TERM GOAL #3   Title Patient will be able to stand and cook for 20-30 min without increase in neck pain    Time 8   Status New     PT LONG TERM GOAL #4   Title Pt will improve balance (goal to be set)    Time 8   Period Weeks   Status New     PT LONG TERM GOAL #5   Title Pt will be able to turn head to drive without increase in neck pain    Time 8   Period Weeks   Status New     PT LONG TERM GOAL #6   Title FOTO score will improve to less than 40% limited in order to demo functional improvement.    Time 8   Period Weeks   Status New               Plan - 12/18/15 1235    Clinical Impression Statement Patient improved despite working all weekend.  Neck pain only with end range stretching.  Low back pain increases with NUStep and standing hip abduction. Cont with POC.    PT Next Visit Plan  Balance screen . Continue HEP for neck and lumbar/trunk.  Modalities for pain.     PT Home Exercise Plan C -AROM, piriformis stretch seated and supine, post pelvic tilts    Consulted and Agree with Plan of Care Patient      Patient will benefit from skilled therapeutic intervention in order to improve the following deficits and impairments:  Decreased range of motion, Difficulty walking, Increased fascial restricitons, Obesity, Pain, Decreased activity tolerance, Impaired flexibility, Improper body mechanics, Decreased strength, Decreased mobility  Visit Diagnosis: Cervicalgia  Radiculopathy, lumbar region  Acute bilateral low back pain with right-sided sciatica  Chronic pain of right knee  Muscle weakness of lower extremity  Difficulty in walking     Problem List Patient Active Problem List   Diagnosis Date Noted  . Uncontrolled type  2 diabetes mellitus with hyperglycemia, without long-term current use of insulin (HCC) 03/28/2015  . Chronic pain syndrome 07/06/2013  . Trochanteric bursitis of left hip 07/06/2013  . Chronic low back pain 07/06/2013  .  Arthritis of left knee 04/15/2013  . Anxiety and depression 03/08/2011    PAA,JENNIFER 12/18/2015, 12:38 PM  [facility] [address] [place], [place], [address] Phone: [phone]   Fax:  [phone]  Name: Madeline Guerrero MRN: [id number] Date of Birth: 10/15/1950  Karie MainlandJennifer Paa, PT 12/18/15 12:38 PM Phone: [phone] Fax: [phone]

## 2015-12-21 ENCOUNTER — Ambulatory Visit: Payer: Self-pay | Admitting: Physical Therapy

## 2015-12-21 DIAGNOSIS — M5441 Lumbago with sciatica, right side: Secondary | ICD-10-CM

## 2015-12-21 DIAGNOSIS — R262 Difficulty in walking, not elsewhere classified: Secondary | ICD-10-CM

## 2015-12-21 DIAGNOSIS — M6281 Muscle weakness (generalized): Secondary | ICD-10-CM

## 2015-12-21 DIAGNOSIS — M25661 Stiffness of right knee, not elsewhere classified: Secondary | ICD-10-CM

## 2015-12-21 DIAGNOSIS — G8929 Other chronic pain: Secondary | ICD-10-CM

## 2015-12-21 DIAGNOSIS — M25561 Pain in right knee: Secondary | ICD-10-CM

## 2015-12-21 DIAGNOSIS — M542 Cervicalgia: Secondary | ICD-10-CM

## 2015-12-21 DIAGNOSIS — M5416 Radiculopathy, lumbar region: Secondary | ICD-10-CM

## 2015-12-21 NOTE — Therapy (Signed)
[place], [place], [address] Phone: [phone]   Fax:  [phone]  Physical Therapy Treatment  Patient Details  Name: Madeline Guerrero MRN: [id number] Date of Birth: 1950-11-17 Referring Provider: Dr. Jean Rosenthal  Encounter Date: 12/21/2015      PT End of Session - 12/21/15 0903    Visit Number 5   Number of Visits 16   Date for PT Re-Evaluation 01/31/16   PT Start Time 0805   PT Stop Time 0908   PT Time Calculation (min) 63 min   Activity Tolerance Patient tolerated treatment well   Behavior During Therapy [facility] for tasks assessed/performed      Past Medical History:  Diagnosis Date  . Diabetes mellitus   . Hypertension     Past Surgical History:  Procedure Laterality Date  . CARPAL TUNNEL RELEASE    . CHOLECYSTECTOMY    . KNEE ARTHROSCOPY Left   . NECK SURGERY    . SPINE SURGERY  2007   cervical    There were no vitals filed for this visit.                       [facility] Adult PT Treatment/Exercise - 12/21/15 0001      Posture/Postural Control   Posture Comments workstation posture handout discussed.     Berg Balance Test   Sit to Stand Able to stand without using hands and stabilize independently   Standing Unsupported Able to stand safely 2 minutes   Sitting with Back Unsupported but Feet Supported on Floor or Stool Able to sit safely and securely 2 minutes   Stand to Sit Sits safely with minimal use of hands   Transfers Able to transfer safely, minor use of hands   Standing Unsupported with Eyes Closed Able to stand 10 seconds safely   Standing Ubsupported with Feet Together Able to place feet together independently and stand 1 minute safely   From Standing, Reach Forward with Outstretched Arm Can reach confidently >25 cm (10")   From Standing Position, Pick up Object from Floor Able to pick up shoe safely and easily   From Standing Position,  Turn to Look Behind Over each Shoulder Looks behind from both sides and weight shifts well   Turn 360 Degrees Able to turn 360 degrees safely but slowly   Standing Unsupported, Alternately Place Feet on Step/Stool Able to stand independently and safely and complete 8 steps in 20 seconds   Standing Unsupported, One Foot in Front Able to place foot tandem independently and hold 30 seconds   Standing on One Leg Able to lift leg independently and hold > 10 seconds   Total Score 54     Neck Exercises: Seated   Other Seated Exercise Cervical AROM 5-10 x each     Shoulder Exercises: Supine   Other Supine Exercises Supine scapular stabilization. series :  ER 7 X to fatigue, Narrow grip flexion 10 x, horizontal abduction 10 X. Diagonals , yellow band , issued for HEP     Moist Heat Therapy   Number Minutes Moist Heat 15 Minutes  sitting   Moist Heat Location Cervical;Lumbar Spine                PT Education - 12/21/15 0859    Education provided Yes   Education Details HEP:  Supine scapular stabilization. Workstation education   [employer]) Educated Patient   Methods Explanation;Demonstration;Tactile cues;Verbal cues;Handout   Comprehension  Returned demonstration          PT Short Term Goals - 12/21/15 0908      PT SHORT TERM GOAL #1   Title Pt will be I with initial HEP for back, neck.    Baseline continue to build initial exercises   Time 4   Period Weeks   Status On-going     PT SHORT TERM GOAL #2   Title Pt will be able to work with less pain in back with work duties (improved 20-25%)   Baseline Able to tolerate more activity at work,  Pain returns by the end of the day   Time 4   Period Weeks   Status On-going     PT [place] #3   Title Pt will understand and demo good body mechanics, lifting when cued in clinic.    Baseline Is more aware at home and work and tries to do    Time 4   Period Weeks   Status On-going     PT SHORT TERM GOAL #4   Title Balance  Screen complete and goal set.    Baseline 54/56   Time 4           PT Long Term Goals - 12/06/15 1247      PT LONG TERM GOAL #1   Title Patient will be independent in safe HEP , self progression for further ROM and strengthening   Time 8   Period Weeks   Status New     PT LONG TERM GOAL #2   Title Pt will be able to walk, squat for work duties without increasing back pain.    Time 4   Period Weeks   Status New     PT LONG TERM GOAL #3   Title Patient will be able to stand and cook for 20-30 min without increase in neck pain    Time 8   Status New     PT LONG TERM GOAL #4   Title Pt will improve balance (goal to be set)    Time 8   Period Weeks   Status New     PT LONG TERM GOAL #5   Title Pt will be able to turn head to drive without increase in neck pain    Time 8   Period Weeks   Status New     PT LONG TERM GOAL #6   Title FOTO score will improve to less than 40% limited in order to demo functional improvement.    Time 8   Period Weeks   Status New               Plan - 12/21/15 0904    Clinical Impression Statement BERG score 54/56.  Patient has already noticed her balance has inproved since starting PT. Progress toward her home ex goal STG#4 partially met.   PT Next Visit Plan  PT to interpert balance screen . Review supine scapular stabilization,  Stretch cervical.  Modalities for pain.  Consider book opener.   PT Home Exercise Plan C -AROM, piriformis stretch seated and supine, post pelvic tilts , supine scapular stabilization, yellow band.   Consulted and Agree with Plan of Care Patient      Patient will benefit from skilled therapeutic intervention in order to improve the following deficits and impairments:  Decreased range of motion, Difficulty walking, Increased fascial restricitons, Obesity, Pain, Decreased activity tolerance, Impaired flexibility, Improper body mechanics, Decreased strength, Decreased mobility  Visit  Diagnosis: Cervicalgia  Radiculopathy, lumbar region  Acute bilateral low back pain with right-sided sciatica  Chronic pain of right knee  Muscle weakness of lower extremity  Difficulty in walking  Joint stiffness of knee, right     Problem List Patient Active Problem List   Diagnosis Date Noted  . Uncontrolled type 2 diabetes mellitus with hyperglycemia, without long-term current use of insulin ([place]) 03/28/2015  . Chronic pain syndrome 07/06/2013  . Trochanteric bursitis of left hip 07/06/2013  . Chronic low back pain 07/06/2013  . Arthritis of left knee 04/15/2013  . Anxiety and depression 03/08/2011    HARRIS,KAREN PTA 12/21/2015, 9:11 AM  [facility] [address] [place], [place], [address] Phone: [phone]   Fax:  [phone]  Name: ARANTZA DARRINGTON MRN: [id number] Date of Birth: 25-Aug-1950

## 2015-12-21 NOTE — Patient Instructions (Signed)
Over Head Pull: Narrow Grip       On back, knees bent, feet flat, band across thighs, elbows straight but relaxed. Pull hands apart (start). Keeping elbows straight, bring arms up and over head, hands toward floor. Keep pull steady on band. Hold momentarily. Return slowly, keeping pull steady, back to start. Repeat 10times. Band color __Yellow____   Side Pull: Double Arm   On back, knees bent, feet flat. Arms perpendicular to body, shoulder level, elbows straight but relaxed. Pull arms out to sides, elbows straight. Resistance band comes across collarbones, hands toward floor. Hold momentarily. Slowly return to starting position. Repeat _10__ times. Band color _Yellow____   Sash   On back, knees bent, feet flat, left hand on left hip, right hand above left. Pull right arm DIAGONALLY (hip to shoulder) across chest. Bring right arm along head toward floor. Hold momentarily. Slowly return to starting position. Repeat _10__ times. Do with left arm. Band color Yellow  Shoulder Rotation: Double Arm   On back, knees bent, feet flat, elbows tucked at sides, bent 90, hands palms up. Pull hands apart and down toward floor, keeping elbows near sides. Hold momentarily. Slowly return to starting position. Repeat _10__ times. Band color yellow

## 2015-12-27 ENCOUNTER — Other Ambulatory Visit (INDEPENDENT_AMBULATORY_CARE_PROVIDER_SITE_OTHER): Payer: Self-pay | Admitting: Orthopaedic Surgery

## 2015-12-27 NOTE — Telephone Encounter (Signed)
Please advise 

## 2015-12-28 ENCOUNTER — Ambulatory Visit (INDEPENDENT_AMBULATORY_CARE_PROVIDER_SITE_OTHER): Payer: Self-pay | Admitting: Orthopaedic Surgery

## 2015-12-28 DIAGNOSIS — M545 Low back pain, unspecified: Secondary | ICD-10-CM

## 2015-12-28 DIAGNOSIS — M542 Cervicalgia: Secondary | ICD-10-CM

## 2015-12-28 DIAGNOSIS — M25561 Pain in right knee: Secondary | ICD-10-CM

## 2015-12-28 MED ORDER — METHYLPREDNISOLONE ACETATE 40 MG/ML IJ SUSP
40.0000 mg | INTRAMUSCULAR | Status: AC | PRN
  Administered 2015-12-28: 40 mg via INTRA_ARTICULAR

## 2015-12-28 MED ORDER — LIDOCAINE HCL 1 % IJ SOLN
3.0000 mL | INTRAMUSCULAR | Status: AC | PRN
  Administered 2015-12-28: 3 mL
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
## 2016-01-01 ENCOUNTER — Ambulatory Visit: Payer: Self-pay | Admitting: Physical Therapy

## 2016-01-01 DIAGNOSIS — M5441 Lumbago with sciatica, right side: Secondary | ICD-10-CM

## 2016-01-01 DIAGNOSIS — M5416 Radiculopathy, lumbar region: Secondary | ICD-10-CM

## 2016-01-01 DIAGNOSIS — M542 Cervicalgia: Secondary | ICD-10-CM

## 2016-01-01 NOTE — Patient Instructions (Signed)

## 2016-01-01 NOTE — Therapy (Signed)
[place], [place], [address] Phone: [phone]   Fax:  [phone]  Physical Therapy Treatment  Patient Details  Name: Madeline Guerrero MRN: [id number] Date of Birth: 06-Jan-1951 Referring Provider: Dr. Jean Rosenthal  Encounter Date: 01/01/2016      PT End of Session - 01/01/16 3154    Visit Number 6   Number of Visits 16   Date for PT Re-Evaluation 01/31/16   PT Start Time 0811  pt late    PT Stop Time 0854   PT Time Calculation (min) 43 min   Activity Tolerance Patient tolerated treatment well   Behavior During Therapy [facility] for tasks assessed/performed      Past Medical History:  Diagnosis Date  . Diabetes mellitus   . Hypertension     Past Surgical History:  Procedure Laterality Date  . CARPAL TUNNEL RELEASE    . CHOLECYSTECTOMY    . KNEE ARTHROSCOPY Left   . NECK SURGERY    . SPINE SURGERY  2007   cervical    There were no vitals filed for this visit.      Subjective Assessment - 01/01/16 0812    Subjective Neck is much better, the MD gave me a shot in my knee. I can walk better.  Back is 3/10. Less pain in lateral leg   Currently in Pain? Yes   Pain Score 3    Pain Location Back   Pain Orientation Lower   Pain Descriptors / Indicators Aching   Pain Type Chronic pain   Pain Onset More than a month ago   Pain Frequency Constant   Aggravating Factors  standing too much, pushing the med cart    Pain Relieving Factors exercises    Pain Score 0   Pain Location Neck              [facility] Adult PT Treatment/Exercise - 01/01/16 0820      Lumbar Exercises: Stretches   Single Knee to Chest Stretch 2 reps;30 seconds   Lower Trunk Rotation Limitations x 10    Pelvic Tilt 5 reps   Pelvic Tilt Limitations x 10      Lumbar Exercises: Supine   Ab Set 10 reps   Clam 10 reps   Heel Slides 10 reps     Shoulder Exercises: Supine   Other Supine Exercises Supine  scapular stabilization. series :  ER 7 X to fatigue, Narrow grip flexion 10 x, horizontal abduction 10 X. Diagonals , yellow band , issued for HEP     Moist Heat Therapy   Number Minutes Moist Heat 15 Minutes   Moist Heat Location Cervical;Lumbar Spine                PT Education - 01/01/16 0905    Education provided Yes   Education Details Gym: pt wanted to use elliptical today, advised bike instead , USE HEP when pain is increased, L stab    Person(s) Educated Patient   Methods Explanation;Demonstration;Tactile cues;Handout;Verbal cues   Comprehension Verbalized understanding;Need further instruction          PT Short Term Goals - 01/01/16 0086      PT SHORT TERM GOAL #1   Title Pt will be I with initial HEP for back, neck.      PT SHORT TERM GOAL #2   Title Pt will be able to work with less pain in back with work duties (improved 20-25%)   Baseline Able  to tolerate more activity at work,  Pain returns by the end of the day, was 10/10 last night    Status On-going     PT [employer] #3   Title Pt will understand and demo good body mechanics, lifting when cued in clinic.    Status On-going     PT SHORT TERM GOAL #4   Title Balance Screen complete and goal set.    Baseline 54/56   Status Achieved           PT Long Term Goals - 01/01/16 4656      PT LONG TERM GOAL #1   Title Patient will be independent in safe HEP , self progression for further ROM and strengthening   Status On-going     PT LONG TERM GOAL #2   Title Pt will be able to walk, squat for work duties without increasing back pain.    Status On-going     PT LONG TERM GOAL #3   Title Patient will be able to stand and cook for 20-30 min without increase in neck pain    Status On-going     PT LONG TERM GOAL #4   Title Pt will improve balance (goal to be set)    Baseline deferred due to high score    Status Deferred     PT LONG TERM GOAL #5   Title Pt will be able to turn head to drive  without increase in neck pain    Baseline min pain with rotation R    Status Partially Met     PT LONG TERM GOAL #6   Title FOTO score will improve to less than 40% limited in order to demo functional improvement.    Status Unable to assess               Plan - 01/01/16 8127    Clinical Impression Statement Pt progressiong well but after work she cont to have severe pain in back and Rt. LE.  Does exercises regularly, needs cues for stabilization ex.  See goals met.  No balance goal needed due to high score.    PT Next Visit Plan Core and scapular stabilization,  Stretch cervical.  Modalities for pain.  Consider book opener.   PT Home Exercise Plan C -AROM, piriformis stretch seated and supine, post pelvic tilts , supine scapular stabilization, yellow band. Added clam and heel slide    Consulted and Agree with Plan of Care Patient      Patient will benefit from skilled therapeutic intervention in order to improve the following deficits and impairments:  Decreased range of motion, Difficulty walking, Increased fascial restricitons, Obesity, Pain, Decreased activity tolerance, Impaired flexibility, Improper body mechanics, Decreased strength, Decreased mobility  Visit Diagnosis: Cervicalgia  Radiculopathy, lumbar region  Acute bilateral low back pain with right-sided sciatica     Problem List Patient Active Problem List   Diagnosis Date Noted  . Uncontrolled type 2 diabetes mellitus with hyperglycemia, without long-term current use of insulin ([place]) 03/28/2015  . Chronic pain syndrome 07/06/2013  . Trochanteric bursitis of left hip 07/06/2013  . Chronic low back pain 07/06/2013  . Arthritis of left knee 04/15/2013  . Anxiety and depression 03/08/2011    Hazelle Woollard 01/01/2016, 9:13 AM  [facility] [address] [place], [place], [address] Phone: [phone]   Fax:  [phone]  Name: Madeline Guerrero MRN: [id number] Date of Birth: March 18, 1950   Raeford Razor, PT  01/01/16 9:23 AM Phone: [phone] Fax: [phone]

## 2016-01-04 ENCOUNTER — Ambulatory Visit: Payer: Self-pay | Admitting: Physical Therapy

## 2016-01-04 DIAGNOSIS — M5416 Radiculopathy, lumbar region: Secondary | ICD-10-CM

## 2016-01-04 DIAGNOSIS — M542 Cervicalgia: Secondary | ICD-10-CM

## 2016-01-04 NOTE — Therapy (Signed)
[place], [place], [address] Phone: [phone]   Fax:  [phone]  Physical Therapy Treatment  Patient Details  Name: Madeline Guerrero MRN: [id number] Date of Birth: 10/14/1950 Referring Provider: Dr. Jean Rosenthal  Encounter Date: 01/04/2016      PT End of Session - 01/04/16 0902    Visit Number 7   Number of Visits 16   Date for PT Re-Evaluation 01/31/16   PT Start Time 0805   PT Stop Time 0906   PT Time Calculation (min) 61 min   Activity Tolerance Patient tolerated treatment well   Behavior During Therapy [facility] for tasks assessed/performed      Past Medical History:  Diagnosis Date  . Diabetes mellitus   . Hypertension     Past Surgical History:  Procedure Laterality Date  . CARPAL TUNNEL RELEASE    . CHOLECYSTECTOMY    . KNEE ARTHROSCOPY Left   . NECK SURGERY    . SPINE SURGERY  2007   cervical    There were no vitals filed for this visit.      Subjective Assessment - 01/04/16 0809    Subjective Hip pain kept me up all night.  Right knee feels great.     Currently in Pain? Yes   Pain Score 3    Pain Location Back   Pain Orientation Lower   Pain Radiating Towards right thigh to knee   Pain Frequency Intermittent   Aggravating Factors  pain meds   Pain Relieving Factors medication ,  trying to use good posture handing out pain meds.   Effect of Pain on Daily Activities --  right ankle 5-6/10 steps [place]             [facility] PT Assessment - 01/04/16 0001      AROM   Cervical - Right Rotation 60   Cervical - Left Rotation 60                     [facility] Adult PT Treatment/Exercise - 01/04/16 0001      Self-Care   Self-Care --  look into arch supports, a few options given, Sex back pain      Lumbar Exercises: Stretches   Double Knee to Chest Stretch --  10 x 5-10 seconds, legs on black ball, Close SBA   Lower Trunk Rotation 5 reps   legs om ball   Pelvic Tilt 5 reps     Lumbar Exercises: Standing   Other Standing Lumbar Exercises Steps noted navicular drops bilaterally .  clogs are unsupportive.     Lumbar Exercises: Supine   Clam 10 reps   Bridge 10 reps   Bridge Limitations legs on ball     Shoulder Exercises: Standing   Horizontal ABduction 10 reps   Theraband Level (Shoulder Horizontal ABduction) Level 1 (Yellow)   External Rotation 10 reps   Theraband Level (Shoulder External Rotation) Level 1 (Yellow)   Extension 10 reps   Theraband Level (Shoulder Extension) Level 1 (Yellow)  single arm each  right with increased mobility, cues initial     Moist Heat Therapy   Number Minutes Moist Heat 15 Minutes   Moist Heat Location Lumbar Spine                PT Education - 01/04/16 0901    Education provided Yes   Education Details Sex and back pain booklet read during moist heat,   insole suggestions  Person(s) Educated Patient   Methods Explanation;Verbal cues   Comprehension Verbalized understanding          PT Short Term Goals - 01/01/16 6222      PT SHORT TERM GOAL #1   Title Pt will be I with initial HEP for back, neck.      PT SHORT TERM GOAL #2   Title Pt will be able to work with less pain in back with work duties (improved 20-25%)   Baseline Able to tolerate more activity at work,  Pain returns by the end of the day, was 10/10 last night    Status On-going     PT [place] #3   Title Pt will understand and demo good body mechanics, lifting when cued in clinic.    Status On-going     PT SHORT TERM GOAL #4   Title Balance Screen complete and goal set.    Baseline 54/56   Status Achieved           PT Long Term Goals - 01/01/16 9798      PT LONG TERM GOAL #1   Title Patient will be independent in safe HEP , self progression for further ROM and strengthening   Status On-going     PT LONG TERM GOAL #2   Title Pt will be able to walk, squat for work duties without  increasing back pain.    Status On-going     PT LONG TERM GOAL #3   Title Patient will be able to stand and cook for 20-30 min without increase in neck pain    Status On-going     PT LONG TERM GOAL #4   Title Pt will improve balance (goal to be set)    Baseline deferred due to high score    Status Deferred     PT LONG TERM GOAL #5   Title Pt will be able to turn head to drive without increase in neck pain    Baseline min pain with rotation R    Status Partially Met     PT LONG TERM GOAL #6   Title FOTO score will improve to less than 40% limited in order to demo functional improvement.    Status Unable to assess               Plan - 01/04/16 0903    Clinical Impression Statement Patient a little depressed with thinking about her family no longer alive this Christmas. SHE RESPONDS  to positive cueing during session.  Knee pain is resolved after recent knee injection.  She is doing her exercises consistantly.  Minimal back pain today .   No new goals met.  She has increased her rotation to 60  degrees bilaterally.   PT Next Visit Plan Core and scapular stabilization,  Stretch cervical.  Modalities for pain.  Consider book opener.   PT Home Exercise Plan continue stretching, stabilization, check to see if she has shoe suppports.    Consulted and Agree with Plan of Care Patient      Patient will benefit from skilled therapeutic intervention in order to improve the following deficits and impairments:  Decreased range of motion, Difficulty walking, Increased fascial restricitons, Obesity, Pain, Decreased activity tolerance, Impaired flexibility, Improper body mechanics, Decreased strength, Decreased mobility  Visit Diagnosis: Cervicalgia  Radiculopathy, lumbar region     Problem List Patient Active Problem List   Diagnosis Date Noted  . Uncontrolled type 2 diabetes mellitus with hyperglycemia,  without long-term current use of insulin ([place]) 03/28/2015  . Chronic pain  syndrome 07/06/2013  . Trochanteric bursitis of left hip 07/06/2013  . Chronic low back pain 07/06/2013  . Arthritis of left knee 04/15/2013  . Anxiety and depression 03/08/2011    HARRIS,KAREN PTA 01/04/2016, 9:10 AM  [facility] [address] [place], [place], [address] Phone: [phone]   Fax:  [phone]  Name: Madeline Guerrero MRN: [id number] Date of Birth: [birth year]

## 2016-01-09 ENCOUNTER — Ambulatory Visit: Payer: Self-pay | Admitting: Physical Therapy

## 2016-01-09 DIAGNOSIS — R262 Difficulty in walking, not elsewhere classified: Secondary | ICD-10-CM

## 2016-01-09 DIAGNOSIS — M5416 Radiculopathy, lumbar region: Secondary | ICD-10-CM

## 2016-01-09 DIAGNOSIS — M6281 Muscle weakness (generalized): Secondary | ICD-10-CM

## 2016-01-09 DIAGNOSIS — M25561 Pain in right knee: Secondary | ICD-10-CM

## 2016-01-09 DIAGNOSIS — M5441 Lumbago with sciatica, right side: Secondary | ICD-10-CM

## 2016-01-09 DIAGNOSIS — G8929 Other chronic pain: Secondary | ICD-10-CM

## 2016-01-09 DIAGNOSIS — M25661 Stiffness of right knee, not elsewhere classified: Secondary | ICD-10-CM

## 2016-01-09 DIAGNOSIS — M542 Cervicalgia: Secondary | ICD-10-CM

## 2016-01-09 NOTE — Therapy (Addendum)
[place], [place], [address] Phone: [phone]   Fax:  [phone]  Physical Therapy Treatment/Discharge  Patient Details  Name: Madeline Guerrero MRN: [id number] Date of Birth: [birth year] Referring Provider: Dr. Jean Rosenthal  Encounter Date: 01/09/2016      PT End of Session - 01/09/16 0923    Visit Number 8   Number of Visits 16   Date for PT Re-Evaluation 01/31/16   PT Start Time 0737   PT Stop Time 0820   PT Time Calculation (min) 43 min   Activity Tolerance Patient tolerated treatment well   Behavior During Therapy [facility] for tasks assessed/performed      Past Medical History:  Diagnosis Date  . Diabetes mellitus   . Hypertension     Past Surgical History:  Procedure Laterality Date  . CARPAL TUNNEL RELEASE    . CHOLECYSTECTOMY    . KNEE ARTHROSCOPY Left   . NECK SURGERY    . SPINE SURGERY  2007   cervical    There were no vitals filed for this visit.      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  7/10 .  It is a lot better.  it did not get sore until the end of my shift.  No leg pain into leg   Currently in Pain? No/denies   Pain Score --  up to 7/10   Pain Location Back   Pain Orientation Lower   Pain Descriptors / Indicators Aching;Contraction   Pain Type Chronic pain   Pain Radiating Towards no   Aggravating Factors  work   Pain Relieving Factors good posture, exercises,  manual to lateral thigh.  Neurotin   Pain Location Neck   Pain Orientation Right;Lateral   Pain Frequency Intermittent   Aggravating Factors  sleeping with head to the side,  using an old pillow   Pain Relieving Factors Neurotin, change of position                         [facility] Adult PT Treatment/Exercise - 01/09/16 0001      Lumbar Exercises: Stretches   Lower Trunk Rotation 5 reps      Lumbar Exercises: Machines for Strengthening   Leg Press 3 sets of 10, 1, plate X 10, 2 plates X 20, cues initially     Lumbar Exercises: Standing   Functional Squats 5 reps   Functional Squats Limitations rod used for education,   Row 10 reps   Theraband Level (Row) Other (comment)   Row Limitations 7 LBS cable cross, cued abdominals,  techniques  Serratus punches, both , 10 x cues and monitored for techniq     Lumbar Exercises: Supine   Ab Set 5 reps   Clam 10 reps   Bridge 10 reps   Bridge Limitations able to lift higher      Knee/Hip Exercises: Stretches   Gastroc Stretch 3 reps;30 seconds  step     Moist Heat Therapy   Number Minutes Moist Heat 15 Minutes   Moist Heat Location Lumbar Spine     Cryotherapy   Number Minutes Cryotherapy 15 Minutes   Cryotherapy Location Knee   Type of Cryotherapy --  cold pack                  PT Short Term Goals - 01/09/16 5852  PT SHORT TERM GOAL #1   Title Pt will be I with initial HEP for back, neck.    Time 4   Period Weeks   Status On-going     PT SHORT TERM GOAL #2   Title Pt will be able to work with less pain in back with work duties (improved 20-25%)   Baseline pain at end of work 7/10.     Time 4   Period Weeks   Status Partially Met     PT SHORT TERM GOAL #3   Title Pt will understand and demo good body mechanics, lifting when cued in clinic.    Time 4   Period Weeks   Status On-going     PT SHORT TERM GOAL #4   Title Balance Screen complete and goal set.    Status Achieved           PT Long Term Goals - 01/01/16 1610      PT LONG TERM GOAL #1   Title Patient will be independent in safe HEP , self progression for further ROM and strengthening   Status On-going     PT LONG TERM GOAL #2   Title Pt will be able to walk, squat for work duties without increasing back pain.    Status On-going     PT LONG TERM GOAL #3   Title Patient will be able to stand and cook for 20-30 min without  increase in neck pain    Status On-going     PT LONG TERM GOAL #4   Title Pt will improve balance (goal to be set)    Baseline deferred due to high score    Status Deferred     PT LONG TERM GOAL #5   Title Pt will be able to turn head to drive without increase in neck pain    Baseline min pain with rotation R    Status Partially Met     PT LONG TERM GOAL #6   Title FOTO score will improve to less than 40% limited in order to demo functional improvement.    Status Unable to assess               Plan - 01/09/16 9604    Clinical Impression Statement Some brief tingling noted in knee after stretching gastroc on step.  Pain and function improving at home.  No new goals met.  STG for pain partially met.   PT Next Visit Plan FOTO.  Stabilization  stretch, check specific goals   PT Home Exercise Plan continue stretching, stabilization, check to see if she has shoe suppports.    Consulted and Agree with Plan of Care Patient      Patient will benefit from skilled therapeutic intervention in order to improve the following deficits and impairments:  Decreased range of motion, Difficulty walking, Increased fascial restricitons, Obesity, Pain, Decreased activity tolerance, Impaired flexibility, Improper body mechanics, Decreased strength, Decreased mobility  Visit Diagnosis: Cervicalgia  Radiculopathy, lumbar region  Acute bilateral low back pain with right-sided sciatica  Chronic pain of right knee  Muscle weakness of lower extremity  Difficulty in walking  Joint stiffness of knee, right  Right knee pain, unspecified chronicity     Problem List Patient Active Problem List   Diagnosis Date Noted  . Uncontrolled type 2 diabetes mellitus with hyperglycemia, without long-term current use of insulin ([place]) 03/28/2015  . Chronic pain syndrome 07/06/2013  . Trochanteric bursitis of left hip 07/06/2013  .  Chronic low back pain 07/06/2013  . Arthritis of left knee 04/15/2013   . Anxiety and depression 03/08/2011    [facility] PTA 01/09/2016, 9:28 AM  [facility] [address] [place], [place], [address] Phone: [phone]   Fax:  [phone]  Name: IZZA BICKLE MRN: [id number] Date of Birth: 08/04/1950   PHYSICAL THERAPY DISCHARGE SUMMARY  Visits from Start of Care: 8  Current functional level related to goals / functional outcomes: See above    Remaining deficits: Pain, stiffness and core, hip weakness.     Education / Equipment: HEP, Economist, posture Plan: Patient agrees to discharge.  Patient goals were not met. Patient is being discharged due to not returning since the last visit.  ?????     Raeford Razor, PT 02/08/16 3:54 PM Phone: [phone] Fax: [phone]

## 2016-01-12 ENCOUNTER — Ambulatory Visit: Payer: Self-pay | Admitting: Physical Therapy

## 2016-01-12 ENCOUNTER — Telehealth: Payer: Self-pay | Admitting: Physical Therapy

## 2016-01-12 NOTE — Telephone Encounter (Signed)
Called patient regarding her missed appt today, 8:00.  She stated she was in a lot of pain today, did not call because it wasn't 24 hours in advance, she "woke up with the pain".  I told her to call and cancel no matter when she knows she isn't coming to PT.  She thought today was her last day.  Her plan of care is still in effect and she will call back to reschedule when she is feeling better.

## 2016-01-22 ENCOUNTER — Other Ambulatory Visit (INDEPENDENT_AMBULATORY_CARE_PROVIDER_SITE_OTHER): Payer: Self-pay | Admitting: Orthopaedic Surgery

## 2016-01-22 NOTE — Telephone Encounter (Signed)
Please advise 

## 2016-01-25 ENCOUNTER — Ambulatory Visit (INDEPENDENT_AMBULATORY_CARE_PROVIDER_SITE_OTHER): Payer: Self-pay | Admitting: Orthopaedic Surgery

## 2016-01-25 DIAGNOSIS — M5441 Lumbago with sciatica, right side: Secondary | ICD-10-CM

## 2016-01-25 DIAGNOSIS — M25561 Pain in right knee: Secondary | ICD-10-CM

## 2016-01-25 DIAGNOSIS — G8929 Other chronic pain: Secondary | ICD-10-CM

## 2016-01-25 MED ORDER — GABAPENTIN 300 MG PO CAPS: 300.0000 mg | ORAL_CAPSULE | Freq: Every day | ORAL | 3 refills | Status: DC

## 2016-01-25 MED ORDER — TIZANIDINE HCL 4 MG PO TABS: 4.0000 mg | ORAL_TABLET | Freq: Two times a day (BID) | ORAL | 0 refills | Status: DC | PRN
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
## 2016-01-26 ENCOUNTER — Telehealth (INDEPENDENT_AMBULATORY_CARE_PROVIDER_SITE_OTHER): Payer: Self-pay | Admitting: Orthopaedic Surgery

## 2016-01-26 NOTE — Telephone Encounter (Signed)
Madeline Guerrero has seen her in the past and has operated on her

## 2016-01-26 NOTE — Telephone Encounter (Signed)
Please [facility]

## 2016-01-26 NOTE — Telephone Encounter (Signed)
Can you see what day may be ok to work her in early if at all possible

## 2016-01-29 NOTE — Telephone Encounter (Signed)
See if she can do 02/15/2016 @ 200pm.  If so this will be ok, Fayrene FearingJames will be here that day.  Just get someone to open the time.

## 2016-01-30 NOTE — Telephone Encounter (Signed)
Can you put her in this spot for me? For Dr. Otelia SergeantNitka. She is aware of appointment

## 2016-02-15 ENCOUNTER — Ambulatory Visit (INDEPENDENT_AMBULATORY_CARE_PROVIDER_SITE_OTHER): Payer: Self-pay | Admitting: Specialist

## 2016-02-21 ENCOUNTER — Other Ambulatory Visit (INDEPENDENT_AMBULATORY_CARE_PROVIDER_SITE_OTHER): Payer: Self-pay | Admitting: Orthopaedic Surgery

## 2016-02-21 NOTE — Telephone Encounter (Signed)
Please advise 

## 2016-02-27 ENCOUNTER — Other Ambulatory Visit (INDEPENDENT_AMBULATORY_CARE_PROVIDER_SITE_OTHER): Payer: Self-pay | Admitting: Orthopaedic Surgery

## 2016-02-27 NOTE — Telephone Encounter (Signed)
Please advise 

## 2016-03-21 ENCOUNTER — Other Ambulatory Visit (INDEPENDENT_AMBULATORY_CARE_PROVIDER_SITE_OTHER): Payer: Self-pay | Admitting: Orthopaedic Surgery

## 2016-03-21 NOTE — Telephone Encounter (Signed)
Please advise 

## 2016-04-28 ENCOUNTER — Encounter (HOSPITAL_COMMUNITY): Payer: Self-pay | Admitting: Emergency Medicine

## 2016-04-28 ENCOUNTER — Emergency Department (HOSPITAL_COMMUNITY): Payer: 59

## 2016-04-28 ENCOUNTER — Inpatient Hospital Stay (HOSPITAL_COMMUNITY)
Admission: EM | Admit: 2016-04-28 | Discharge: 2016-05-02 | DRG: 372 | Disposition: A | Discharge status: Home / Self Care | Payer: 59 | Attending: Internal Medicine | Admitting: Internal Medicine

## 2016-04-28 DIAGNOSIS — Z794 Long term (current) use of insulin: Secondary | ICD-10-CM

## 2016-04-28 DIAGNOSIS — F419 Anxiety disorder, unspecified: Secondary | ICD-10-CM | POA: Diagnosis present

## 2016-04-28 DIAGNOSIS — Z9049 Acquired absence of other specified parts of digestive tract: Secondary | ICD-10-CM

## 2016-04-28 DIAGNOSIS — E1165 Type 2 diabetes mellitus with hyperglycemia: Secondary | ICD-10-CM | POA: Diagnosis present

## 2016-04-28 DIAGNOSIS — Z79899 Other long term (current) drug therapy: Secondary | ICD-10-CM

## 2016-04-28 DIAGNOSIS — I10 Essential (primary) hypertension: Secondary | ICD-10-CM | POA: Diagnosis present

## 2016-04-28 DIAGNOSIS — A0472 Enterocolitis due to Clostridium difficile, not specified as recurrent: Principal | ICD-10-CM | POA: Diagnosis present

## 2016-04-28 DIAGNOSIS — F17210 Nicotine dependence, cigarettes, uncomplicated: Secondary | ICD-10-CM | POA: Diagnosis present

## 2016-04-28 DIAGNOSIS — F329 Major depressive disorder, single episode, unspecified: Secondary | ICD-10-CM | POA: Diagnosis present

## 2016-04-28 DIAGNOSIS — D649 Anemia, unspecified: Secondary | ICD-10-CM | POA: Diagnosis present

## 2016-04-28 DIAGNOSIS — N179 Acute kidney failure, unspecified: Secondary | ICD-10-CM | POA: Diagnosis present

## 2016-04-28 DIAGNOSIS — E876 Hypokalemia: Secondary | ICD-10-CM | POA: Diagnosis present

## 2016-04-28 DIAGNOSIS — R739 Hyperglycemia, unspecified: Secondary | ICD-10-CM

## 2016-04-28 DIAGNOSIS — Z888 Allergy status to other drugs, medicaments and biological substances status: Secondary | ICD-10-CM

## 2016-04-28 DIAGNOSIS — G894 Chronic pain syndrome: Secondary | ICD-10-CM | POA: Diagnosis present

## 2016-04-28 DIAGNOSIS — Z881 Allergy status to other antibiotic agents status: Secondary | ICD-10-CM

## 2016-04-28 DIAGNOSIS — Z886 Allergy status to analgesic agent status: Secondary | ICD-10-CM

## 2016-04-28 LAB — COMPREHENSIVE METABOLIC PANEL
ALT: 16 U/L (ref 14–54)
AST: 21 U/L (ref 15–41)
Albumin: 3.7 g/dL (ref 3.5–5.0)
Alkaline Phosphatase: 81 U/L (ref 38–126)
Anion gap: 11 (ref 5–15)
BILIRUBIN TOTAL: 0.5 mg/dL (ref 0.3–1.2)
BUN: 18 mg/dL (ref 6–20)
CHLORIDE: 102 mmol/L (ref 101–111)
CO2: 22 mmol/L (ref 22–32)
Calcium: 9.2 mg/dL (ref 8.9–10.3)
Creatinine, Ser: 1.15 mg/dL — ABNORMAL HIGH (ref 0.44–1.00)
GFR calc Af Amer: 57 mL/min — ABNORMAL LOW (ref >60)
GFR, EST NON AFRICAN AMERICAN: 49 mL/min — AB (ref >60)
Glucose, Bld: 238 mg/dL — ABNORMAL HIGH (ref 65–99)
Potassium: 3.4 mmol/L — ABNORMAL LOW (ref 3.5–5.1)
Sodium: 135 mmol/L (ref 135–145)
Total Protein: 7.4 g/dL (ref 6.5–8.1)

## 2016-04-28 LAB — CBC WITH DIFFERENTIAL/PLATELET
Basophils Absolute: 0 10*3/uL (ref 0.0–0.1)
Basophils Relative: 0 %
Eosinophils Absolute: 0.2 10*3/uL (ref 0.0–0.7)
Eosinophils Relative: 1 %
HCT: 33.6 % — ABNORMAL LOW (ref 36.0–46.0)
HEMOGLOBIN: 11.6 g/dL — AB (ref 12.0–15.0)
LYMPHS ABS: 2.7 10*3/uL (ref 0.7–4.0)
LYMPHS PCT: 25 %
MCH: 32.1 pg (ref 26.0–34.0)
MCHC: 34.5 g/dL (ref 30.0–36.0)
MCV: 93.1 fL (ref 78.0–100.0)
Monocytes Absolute: 0.4 10*3/uL (ref 0.1–1.0)
Monocytes Relative: 4 %
NEUTROS ABS: 7.6 10*3/uL (ref 1.7–7.7)
NEUTROS PCT: 70 %
Platelets: 278 10*3/uL (ref 150–400)
RBC: 3.61 MIL/uL — AB (ref 3.87–5.11)
RDW: 12.8 % (ref 11.5–15.5)
WBC: 10.9 10*3/uL — AB (ref 4.0–10.5)

## 2016-04-28 LAB — GLUCOSE, CAPILLARY
GLUCOSE-CAPILLARY: 156 mg/dL — AB (ref 65–99)
GLUCOSE-CAPILLARY: 142 mg/dL — AB (ref 65–99)
Glucose-Capillary: 166 mg/dL — ABNORMAL HIGH (ref 65–99)

## 2016-04-28 LAB — LIPASE, BLOOD: Lipase: 32 U/L (ref 11–51)

## 2016-04-28 LAB — MAGNESIUM: Magnesium: 1.4 mg/dL — ABNORMAL LOW (ref 1.7–2.4)

## 2016-04-28 LAB — C DIFFICILE QUICK SCREEN W PCR REFLEX
C DIFFICILE (CDIFF) INTERP: DETECTED
C Diff antigen: POSITIVE — AB
C Diff toxin: POSITIVE — AB

## 2016-04-28 LAB — LACTIC ACID, PLASMA
LACTIC ACID, VENOUS: 0.7 mmol/L (ref 0.5–1.9)
LACTIC ACID, VENOUS: 1.4 mmol/L (ref 0.5–1.9)

## 2016-04-28 LAB — PHOSPHORUS: Phosphorus: 3.2 mg/dL (ref 2.5–4.6)

## 2016-04-28 MED ORDER — VANCOMYCIN 50 MG/ML ORAL SOLUTION
125.0000 mg | Freq: Four times a day (QID) | ORAL | Status: DC
  Administered 2016-04-28 – 2016-05-02 (×16): 125 mg via ORAL
  Filled 2016-04-28 (×20): qty 2.5

## 2016-04-28 MED ORDER — ACETAMINOPHEN 650 MG RE SUPP: 650.0000 mg | Freq: Four times a day (QID) | RECTAL | Status: DC | PRN

## 2016-04-28 MED ORDER — HYDROMORPHONE HCL 1 MG/ML IJ SOLN
0.5000 mg | INTRAMUSCULAR | Status: DC | PRN
  Administered 2016-04-28 – 2016-04-30 (×11): 0.5 mg via INTRAVENOUS
  Filled 2016-04-28 (×12): qty 1

## 2016-04-28 MED ORDER — ONDANSETRON HCL 4 MG/2ML IJ SOLN
4.0000 mg | Freq: Once | INTRAMUSCULAR | Status: AC
  Administered 2016-04-28: 4 mg via INTRAVENOUS
  Filled 2016-04-28: qty 2

## 2016-04-28 MED ORDER — ONDANSETRON HCL 4 MG PO TABS
4.0000 mg | ORAL_TABLET | Freq: Four times a day (QID) | ORAL | Status: DC | PRN
  Administered 2016-04-28: 4 mg via ORAL
  Filled 2016-04-28: qty 1

## 2016-04-28 MED ORDER — ENOXAPARIN SODIUM 40 MG/0.4ML ~~LOC~~ SOLN
40.0000 mg | SUBCUTANEOUS | Status: DC
  Administered 2016-04-28 – 2016-04-30 (×3): 40 mg via SUBCUTANEOUS
  Filled 2016-04-28 (×3): qty 0.4

## 2016-04-28 MED ORDER — ACETAMINOPHEN 325 MG PO TABS
650.0000 mg | ORAL_TABLET | Freq: Four times a day (QID) | ORAL | Status: DC | PRN
  Administered 2016-04-28 – 2016-04-29 (×2): 650 mg via ORAL
  Filled 2016-04-28 (×2): qty 2

## 2016-04-28 MED ORDER — SODIUM CHLORIDE 0.9 % IV BOLUS (SEPSIS)
1000.0000 mL | Freq: Once | INTRAVENOUS | Status: AC
  Administered 2016-04-28: 1000 mL via INTRAVENOUS

## 2016-04-28 MED ORDER — SODIUM CHLORIDE 0.9 % IV SOLN
INTRAVENOUS | Status: DC
  Administered 2016-04-28 – 2016-04-29 (×4): via INTRAVENOUS

## 2016-04-28 MED ORDER — ONDANSETRON HCL 4 MG/2ML IJ SOLN
4.0000 mg | Freq: Four times a day (QID) | INTRAMUSCULAR | Status: DC | PRN
  Administered 2016-04-30: 4 mg via INTRAVENOUS
  Filled 2016-04-28: qty 2

## 2016-04-28 MED ORDER — IOPAMIDOL (ISOVUE-300) INJECTION 61%
INTRAVENOUS | Status: AC
  Administered 2016-04-28: 100 mL
  Filled 2016-04-28: qty 100

## 2016-04-28 MED ORDER — INSULIN ASPART 100 UNIT/ML ~~LOC~~ SOLN
0.0000 [IU] | SUBCUTANEOUS | Status: DC
  Administered 2016-04-28: 3 [IU] via SUBCUTANEOUS
  Administered 2016-04-28: 4 [IU] via SUBCUTANEOUS
  Administered 2016-04-29 (×2): 3 [IU] via SUBCUTANEOUS
  Administered 2016-04-29 – 2016-04-30 (×2): 4 [IU] via SUBCUTANEOUS
  Administered 2016-05-01: 3 [IU] via SUBCUTANEOUS
  Administered 2016-05-01: 7 [IU] via SUBCUTANEOUS
  Administered 2016-05-02 (×4): 4 [IU] via SUBCUTANEOUS

## 2016-04-28 MED ORDER — SODIUM CHLORIDE 0.9% FLUSH
3.0000 mL | Freq: Two times a day (BID) | INTRAVENOUS | Status: DC
  Administered 2016-04-28 – 2016-05-01 (×6): 3 mL via INTRAVENOUS

## 2016-04-28 NOTE — Progress Notes (Signed)
Incentive spirometry given to patient.  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 

## 2016-04-28 NOTE — ED Notes (Signed)
Pt. Husband educated on washing hands and wearing a gown when visiting patient.

## 2016-04-28 NOTE — Progress Notes (Signed)
Patient refusing insulin coverage.  MD notified.

## 2016-04-28 NOTE — ED Provider Notes (Signed)
[facility]-EMERGENCY DEPT Provider Note   CSN: [id number] Arrival date & time: 04/28/16  8295     History   Chief Complaint Chief Complaint  Patient presents with  . Diarrhea  . Abdominal Pain    HPI Madiha Bambrick Clarillo-Decker is a 66 y.o. female.  HPI  66 year old female presents today complaining of diarrhea for the past 3 days. She states that it has worsened and she is now having frequent mucousy stools. She denies any blood. She states she has been exposed to C. difficile from her mother-in-law and her husband. She was on antibiotics for a leg infection. She has had some subjective fever but no chills.  Past Medical History:  Diagnosis Date  . Diabetes mellitus   . Hypertension     Patient Active Problem List   Diagnosis Date Noted  . Uncontrolled type 2 diabetes mellitus with hyperglycemia, without long-term current use of insulin ([facility]) 03/28/2015  . Chronic pain syndrome 07/06/2013  . Trochanteric bursitis of left hip 07/06/2013  . Chronic low back pain 07/06/2013  . Arthritis of left knee 04/15/2013  . Anxiety and depression 03/08/2011    Past Surgical History:  Procedure Laterality Date  . CARPAL TUNNEL RELEASE    . CHOLECYSTECTOMY    . KNEE ARTHROSCOPY Left   . NECK SURGERY    . SPINE SURGERY  2007   cervical    OB History    No data available       Home Medications    Prior to Admission medications   Medication Sig Start Date End Date Taking? Authorizing Provider  diclofenac sodium (VOLTAREN) 1 % GEL Apply 2 g topically 4 (four) times daily. 07/06/13   Erick Colace, MD  gabapentin (NEURONTIN) 300 MG capsule Take 1 capsule (300 mg total) by mouth at bedtime. Patient not taking: Reported on 04/28/2016 01/25/16   Kathryne Hitch, MD  gabapentin (NEURONTIN) 300 MG capsule TAKE 1 CAPSULE BY MOUTH AT BEDTIME 03/21/16   Kathryne Hitch, MD  glipiZIDE (GLUCOTROL XL) 10 MG 24 hr tablet Take 1 tablet (10 mg total) by mouth daily with breakfast.  10/27/15   Collie Siad English, PA  ibuprofen (ADVIL,MOTRIN) 600 MG tablet Take 1 tablet (600 mg total) by mouth every 6 (six) hours as needed. 11/11/15   Roxy Horseman, PA-C  Insulin Detemir (LEVEMIR FLEXTOUCH) 100 UNIT/ML Pen Inject 14 Units into the skin at bedtime. 03/29/15   Carlus Pavlov, MD  lisinopril-hydrochlorothiazide (PRINZIDE,ZESTORETIC) 20-25 MG tablet Take 1 tablet by mouth daily. 07/15/15   Collene Gobble, MD  metFORMIN (GLUCOPHAGE) 1000 MG tablet Take 1 tablet (1,000 mg total) by mouth 2 (two) times daily with a meal. 07/15/15   Collene Gobble, MD  methocarbamol (ROBAXIN) 500 MG tablet TAKE 1 TABLET (500 MG TOTAL) BY MOUTH EVERY 8 (EIGHT) HOURS AS NEEDED FOR MUSCLE SPASMS. 12/27/15   Kathryne Hitch, MD  PARoxetine (PAXIL) 20 MG tablet Take 1 tablet (20 mg total) by mouth daily. 07/30/12   Elvina Sidle, MD  pravastatin (PRAVACHOL) 20 MG tablet Take 1 tablet daily 07/15/15   Collene Gobble, MD  tiZANidine (ZANAFLEX) 4 MG capsule TAKE 1 TABLET (4 MG TOTAL) BY MOUTH 2 (TWO) TIMES DAILY AS NEEDED FOR MUSCLE SPASMS. 02/27/16   Kathryne Hitch, MD    Family History No family history on file.  Social History Social History  Substance Use Topics  . Smoking status: Current Some Day Smoker    Packs/day: 0.50  Years: 10.00    Types: Cigarettes  . Smokeless tobacco: Never Used  . Alcohol use No     Allergies   Onglyza [saxagliptin]; Nsaids; Robaxin [methocarbamol]; Tradjenta [linagliptin]; Ultram [tramadol]; and Levaquin [levofloxacin]   Review of Systems Review of Systems  All other systems reviewed and are negative.    Physical Exam Updated Vital Signs BP 135/66 (BP Location: Left Arm)   Pulse 66   Temp 98.5 F (36.9 C) (Oral)   Resp 17   Ht  (1.626 m)   Wt 101.2 kg   SpO2 97%   BMI 38.28 kg/m   Physical Exam  Constitutional: She is oriented to person, place, and time. She appears well-developed and well-nourished. No distress.  HENT:  Head:  Normocephalic and atraumatic.  Right Ear: External ear normal.  Left Ear: External ear normal.  Nose: Nose normal.  Eyes: Conjunctivae and EOM are normal. Pupils are equal, round, and reactive to light.  Neck: Normal range of motion. Neck supple.  Cardiovascular: Normal rate and regular rhythm.   Pulmonary/Chest: Effort normal and breath sounds normal.  Abdominal: Soft. Bowel sounds are normal. There is tenderness.  llq ttp  Musculoskeletal: Normal range of motion.  Neurological: She is alert and oriented to person, place, and time. She exhibits normal muscle tone. Coordination normal.  Skin: Skin is warm and dry.  Psychiatric: She has a normal mood and affect. Her behavior is normal. Thought content normal.  Nursing note and vitals reviewed.    ED Treatments / Results  Labs (all labs ordered are listed, but only abnormal results are displayed) Labs Reviewed  C DIFFICILE QUICK SCREEN W PCR REFLEX - Abnormal; Notable for the following:       Result Value   C Diff antigen POSITIVE (*)    C Diff toxin POSITIVE (*)    All other components within normal limits  COMPREHENSIVE METABOLIC PANEL - Abnormal; Notable for the following:    Potassium 3.4 (*)    Glucose, Bld 238 (*)    Creatinine, Ser 1.15 (*)    GFR calc non Af Amer 49 (*)    GFR calc Af Amer 57 (*)    All other components within normal limits  CBC WITH DIFFERENTIAL/PLATELET - Abnormal; Notable for the following:    WBC 10.9 (*)    RBC 3.61 (*)    Hemoglobin 11.6 (*)    HCT 33.6 (*)    All other components within normal limits  LIPASE, BLOOD  URINALYSIS, ROUTINE W REFLEX MICROSCOPIC    EKG  EKG Interpretation None       Radiology No results found.  Procedures Procedures (including critical care time)  Medications Ordered in ED Medications  sodium chloride 0.9 % bolus 1,000 mL (1,000 mLs Intravenous New Bag/Given 04/28/16 0919)  iopamidol (ISOVUE-300) 61 % injection (100 mLs  Contrast Given 04/28/16 1041)      Initial Impression / Assessment and Plan / ED Course  I have reviewed the triage vital signs and the nursing notes.  Pertinent labs & imaging results that were available during my care of the patient were reviewed by me and considered in my medical decision making (see chart for details).     1-c.diff diarrhea- patient with some associated volume depletion and aki. Po vancomycin ordered per c. Dif order set.  2- AKI- creatinine increased to 1.15 from baseline .9 3- type 2 dm with hyperglycemia without dka-anion gap 11  Discussed with Dr. Melynda Ripple and he will admit  Final Clinical Impressions(s) / ED Diagnoses   Final diagnoses:  C. difficile colitis  Hyperglycemia  AKI (acute kidney injury) (HCC)    New Prescriptions New Prescriptions   No medications on file     Margarita Grizzle, MD 04/28/16 1233

## 2016-04-28 NOTE — H&P (Signed)
[facility] History and Physical  Kaleen Rochette Guerrero ZOX:[id number] DOB: [birth year] DOA: 04/28/2016  Referring physician:  PCP: Billee Cashing, MD   Chief Complaint: "The diarrhea got real bad yesterday."  HPI: Madeline Guerrero is a 66 y.o. female  with past history of diabetes, hypertension, chronic pain and depression presents to the emergency room with chief complaint of severe diarrhea for last 2-3 days. Of note multiple members of the patient's household had C. difficile diarrhea. Patient states she's also had associated chills. Has had several episodes of watery diarrhea. Denies any bloody diarrhea. Patient has nausea but no vomiting. Patient can tolerate by mouth but it makes her have large bouts of diarrhea. This is cause food avoidance. Patient has been taking her home medications except for metformin. Patient denies any abdominal trauma. Patient has not has C. difficile before.  ED course: CT done of the abdomen and pelvis with contrast showing left-sided colitis. Labs consistent with C. difficile. Patient started on oral vancomycin. Hospitalist consulted for admission.   Review of Systems:  As per HPI otherwise 10 point review of systems negative.    Past Medical History:  Diagnosis Date  . Diabetes mellitus   . Hypertension    Past Surgical History:  Procedure Laterality Date  . CARPAL TUNNEL RELEASE    . CHOLECYSTECTOMY    . KNEE ARTHROSCOPY Left   . NECK SURGERY    . SPINE SURGERY  2007   cervical   Social History:  reports that she has been smoking Cigarettes.  She has a 5.00 pack-year smoking history. She has never used smokeless tobacco. She reports that she does not drink alcohol or use drugs.  Allergies  Allergen Reactions  . Onglyza [Saxagliptin] Swelling  . Nsaids Swelling  . Robaxin [Methocarbamol] Swelling    Swelling in her legs.  Jearld Lesch [Linagliptin] Swelling    Tongue and lips  . Ultram [Tramadol] Nausea And Vomiting   . Levaquin [Levofloxacin] Rash    No family history on file.   Prior to Admission medications   Medication Sig Start Date End Date Taking? Authorizing Provider  diclofenac sodium (VOLTAREN) 1 % GEL Apply 2 g topically 4 (four) times daily. 07/06/13   Erick Colace, MD  gabapentin (NEURONTIN) 300 MG capsule Take 1 capsule (300 mg total) by mouth at bedtime. Patient not taking: Reported on 04/28/2016 01/25/16   Kathryne Hitch, MD  gabapentin (NEURONTIN) 300 MG capsule TAKE 1 CAPSULE BY MOUTH AT BEDTIME 03/21/16   Kathryne Hitch, MD  glipiZIDE (GLUCOTROL XL) 10 MG 24 hr tablet Take 1 tablet (10 mg total) by mouth daily with breakfast. 10/27/15   Collie Siad English, PA  ibuprofen (ADVIL,MOTRIN) 600 MG tablet Take 1 tablet (600 mg total) by mouth every 6 (six) hours as needed. 11/11/15   Roxy Horseman, PA-C  Insulin Detemir (LEVEMIR FLEXTOUCH) 100 UNIT/ML Pen Inject 14 Units into the skin at bedtime. 03/29/15   Carlus Pavlov, MD  lisinopril-hydrochlorothiazide (PRINZIDE,ZESTORETIC) 20-25 MG tablet Take 1 tablet by mouth daily. 07/15/15   Collene Gobble, MD  metFORMIN (GLUCOPHAGE) 1000 MG tablet Take 1 tablet (1,000 mg total) by mouth 2 (two) times daily with a meal. 07/15/15   Collene Gobble, MD  methocarbamol (ROBAXIN) 500 MG tablet TAKE 1 TABLET (500 MG TOTAL) BY MOUTH EVERY 8 (EIGHT) HOURS AS NEEDED FOR MUSCLE SPASMS. 12/27/15   Kathryne Hitch, MD  PARoxetine (PAXIL) 20 MG tablet Take 1 tablet (20 mg total) by mouth daily.  07/30/12   Elvina Sidle, MD  pravastatin (PRAVACHOL) 20 MG tablet Take 1 tablet daily 07/15/15   Collene Gobble, MD  tiZANidine (ZANAFLEX) 4 MG capsule TAKE 1 TABLET (4 MG TOTAL) BY MOUTH 2 (TWO) TIMES DAILY AS NEEDED FOR MUSCLE SPASMS. 02/27/16   Kathryne Hitch, MD   Physical Exam: Vitals:   04/28/16 0945 04/28/16 1025 04/28/16 1200 04/28/16 1215  BP:   138/67 137/69  Pulse: 71 66 80 82  Resp:   16   Temp:      TempSrc:      SpO2: 97% 97%  100% 100%  Weight:      Height:        Wt Readings from Last 3 Encounters:  04/28/16 101.2 kg (223 lb)  10/27/15 100.7 kg (222 lb)  07/15/15 103.1 kg (227 lb 6 oz)    General:  Appears calm and comfortable, A&Ox3 Eyes:  PERRL, EOMI, normal lids, iris ENT:  grossly normal hearing, lips & tongue Neck:  no LAD, masses or thyromegaly Cardiovascular:  RRR, no m/r/g. No LE edema.  Respiratory:  CTA bilaterally, no w/r/r. Normal respiratory effort. Abdomen:  soft, full on L, ND, L side TTP, no rebound or guarding Skin:  no rash or induration seen on limited exam Musculoskeletal:  grossly normal tone BUE/BLE Psychiatric:  grossly normal mood and affect, speech fluent and appropriate Neurologic:  CN 2-12 grossly intact, moves all extremities in coordinated fashion.          Labs on Admission:  Basic Metabolic Panel:  Recent Labs Lab 04/28/16 0855  NA 135  K 3.4*  CL 102  CO2 22  GLUCOSE 238*  BUN 18  CREATININE 1.15*  CALCIUM 9.2   Liver Function Tests:  Recent Labs Lab 04/28/16 0855  AST 21  ALT 16  ALKPHOS 81  BILITOT 0.5  PROT 7.4  ALBUMIN 3.7    Recent Labs Lab 04/28/16 0855  LIPASE 32   No results for input(s): AMMONIA in the last 168 hours. CBC:  Recent Labs Lab 04/28/16 0855  WBC 10.9*  NEUTROABS 7.6  HGB 11.6*  HCT 33.6*  MCV 93.1  PLT 278   Cardiac Enzymes: No results for input(s): CKTOTAL, CKMB, CKMBINDEX, TROPONINI in the last 168 hours.  BNP (last 3 results) No results for input(s): BNP in the last 8760 hours.  ProBNP (last 3 results) No results for input(s): PROBNP in the last 8760 hours.   Serum creatinine: 1.15 mg/dL (H) 19/14/78 2956 Estimated creatinine clearance: 57.2 mL/min (A)  CBG: No results for input(s): GLUCAP in the last 168 hours.  Radiological Exams on Admission: Ct Abdomen Pelvis W Contrast  Result Date: 04/28/2016 CLINICAL DATA:  Abdominal pain for 2 days. Left lower quadrant centered. EXAM: CT ABDOMEN AND  PELVIS WITH CONTRAST TECHNIQUE: Multidetector CT imaging of the abdomen and pelvis was performed using the standard protocol following bolus administration of intravenous contrast. CONTRAST:  ISOVUE-300 IOPAMIDOL (ISOVUE-300) INJECTION 61% COMPARISON:  None. FINDINGS: Lower chest: Clear lung bases. Normal heart size without pericardial or pleural effusion. Hepatobiliary: Normal liver. Cholecystectomy, without biliary ductal dilatation. Pancreas: Normal, without mass or ductal dilatation. Spleen: Normal in size, without focal abnormality. Adrenals/Urinary Tract: Normal adrenal glands. Normal kidneys, without hydronephrosis. Normal urinary bladder. Stomach/Bowel: Normal stomach, without wall thickening. mild left-sided colonic wall thickening, including on images 40 and 44/series 3. Possible pericolonic edema involving the descending colon. Normal terminal ileum and appendix. Normal small bowel. Vascular/Lymphatic: Aortic and branch vessel atherosclerosis. No  abdominopelvic adenopathy. Reproductive: Intrauterine device.  No adnexal mass. Other: No significant free fluid. Musculoskeletal: No acute osseous abnormality. IMPRESSION: 1. Findings suspicious for mild left-sided colitis. Distribution could be infection. Rule out C. difficile infection. Ischemic bowel is felt less likely, given relatively mild aortic and mesenteric atherosclerosis. 2.  Aortic atherosclerosis. Electronically Signed   By: Jeronimo Greaves M.D.   On: 04/28/2016 11:41    EKG: pending  Assessment/Plan Principal Problem:   Clostridium difficile diarrhea Active Problems:   Anxiety and depression   Chronic pain syndrome   Uncontrolled type 2 diabetes mellitus with hyperglycemia, without long-term current use of insulin ([facility])   C diff colitis w/ diarrhea CT abd & pelvis showed colitis, C diff pos lab Patient hemodynamically stable Given oral vanc emergency room, will continue Urine culture pending Blood cultures 2 pending Patient  given 100 mL of fluid in the emergency room Lactic acid normal I&O  Anxiety and depression No sx Denies SI/HI  Chronic pain Prn dilaudid for now Hold home meds  DM Check A1c SSI Q4 Hold LA insulin  Complete med rec pending  Code Status: FULL DVT Prophylaxis: Lovenox Family Communication: husband at bedisde Disposition Plan: Pending Improvement  Status: tele inpt  Haydee Salter, MD Family Medicine [facility] [email or web address] Password [email or web address]

## 2016-04-28 NOTE — Progress Notes (Signed)
Patient arrived on unit via stretcher from ED. Husband at bedside.  Telemetry placed per MD order and CMT notified.

## 2016-04-28 NOTE — ED Notes (Signed)
Pt transported to CT ?

## 2016-04-28 NOTE — Progress Notes (Signed)
Discussed with charge nurse concerns about possible infectious disease issue due to family not complying with enteric protocol options. He stated he will investigate.  Haydee Salter MD

## 2016-04-28 NOTE — ED Notes (Addendum)
Pt. Ambulated to restroom independently with steady gait.   Pt. Given container for stool sample.

## 2016-04-28 NOTE — ED Notes (Signed)
Admitting at bedside 

## 2016-04-28 NOTE — ED Triage Notes (Signed)
Pt. Stated, I've had diarrhea for 3 days continuous every time I put something in my mouth.

## 2016-04-29 DIAGNOSIS — Z886 Allergy status to analgesic agent status: Secondary | ICD-10-CM | POA: Diagnosis not present

## 2016-04-29 DIAGNOSIS — E876 Hypokalemia: Secondary | ICD-10-CM

## 2016-04-29 DIAGNOSIS — Z794 Long term (current) use of insulin: Secondary | ICD-10-CM | POA: Diagnosis not present

## 2016-04-29 DIAGNOSIS — E1165 Type 2 diabetes mellitus with hyperglycemia: Secondary | ICD-10-CM | POA: Diagnosis present

## 2016-04-29 DIAGNOSIS — Z79899 Other long term (current) drug therapy: Secondary | ICD-10-CM | POA: Diagnosis not present

## 2016-04-29 DIAGNOSIS — D649 Anemia, unspecified: Secondary | ICD-10-CM | POA: Diagnosis present

## 2016-04-29 DIAGNOSIS — Z9049 Acquired absence of other specified parts of digestive tract: Secondary | ICD-10-CM | POA: Diagnosis not present

## 2016-04-29 DIAGNOSIS — F329 Major depressive disorder, single episode, unspecified: Secondary | ICD-10-CM

## 2016-04-29 DIAGNOSIS — Z888 Allergy status to other drugs, medicaments and biological substances status: Secondary | ICD-10-CM | POA: Diagnosis not present

## 2016-04-29 DIAGNOSIS — A0472 Enterocolitis due to Clostridium difficile, not specified as recurrent: Secondary | ICD-10-CM | POA: Diagnosis present

## 2016-04-29 DIAGNOSIS — F419 Anxiety disorder, unspecified: Secondary | ICD-10-CM | POA: Diagnosis present

## 2016-04-29 DIAGNOSIS — Z881 Allergy status to other antibiotic agents status: Secondary | ICD-10-CM | POA: Diagnosis not present

## 2016-04-29 DIAGNOSIS — N179 Acute kidney failure, unspecified: Secondary | ICD-10-CM | POA: Diagnosis present

## 2016-04-29 DIAGNOSIS — G894 Chronic pain syndrome: Secondary | ICD-10-CM | POA: Diagnosis present

## 2016-04-29 DIAGNOSIS — F17210 Nicotine dependence, cigarettes, uncomplicated: Secondary | ICD-10-CM | POA: Diagnosis present

## 2016-04-29 DIAGNOSIS — I10 Essential (primary) hypertension: Secondary | ICD-10-CM | POA: Diagnosis present

## 2016-04-29 LAB — GLUCOSE, CAPILLARY
GLUCOSE-CAPILLARY: 111 mg/dL — AB (ref 65–99)
GLUCOSE-CAPILLARY: 121 mg/dL — AB (ref 65–99)
GLUCOSE-CAPILLARY: 192 mg/dL — AB (ref 65–99)
GLUCOSE-CAPILLARY: 83 mg/dL (ref 65–99)
Glucose-Capillary: 146 mg/dL — ABNORMAL HIGH (ref 65–99)
Glucose-Capillary: 84 mg/dL (ref 65–99)

## 2016-04-29 LAB — CBC
HCT: 33.9 % — ABNORMAL LOW (ref 36.0–46.0)
Hemoglobin: 11.2 g/dL — ABNORMAL LOW (ref 12.0–15.0)
MCH: 31.5 pg (ref 26.0–34.0)
MCHC: 33 g/dL (ref 30.0–36.0)
MCV: 95.2 fL (ref 78.0–100.0)
PLATELETS: 239 10*3/uL (ref 150–400)
RBC: 3.56 MIL/uL — AB (ref 3.87–5.11)
RDW: 12.8 % (ref 11.5–15.5)
WBC: 12.2 10*3/uL — AB (ref 4.0–10.5)

## 2016-04-29 LAB — HIV ANTIBODY (ROUTINE TESTING W REFLEX): HIV SCREEN 4TH GENERATION: NONREACTIVE

## 2016-04-29 LAB — BASIC METABOLIC PANEL
ANION GAP: 12 (ref 5–15)
BUN: 9 mg/dL (ref 6–20)
CALCIUM: 8.7 mg/dL — AB (ref 8.9–10.3)
CO2: 20 mmol/L — ABNORMAL LOW (ref 22–32)
CREATININE: 1.04 mg/dL — AB (ref 0.44–1.00)
Chloride: 106 mmol/L (ref 101–111)
GFR calc non Af Amer: 56 mL/min — ABNORMAL LOW (ref >60)
Glucose, Bld: 144 mg/dL — ABNORMAL HIGH (ref 65–99)
Potassium: 3.2 mmol/L — ABNORMAL LOW (ref 3.5–5.1)
SODIUM: 138 mmol/L (ref 135–145)

## 2016-04-29 LAB — HEMOGLOBIN A1C
HEMOGLOBIN A1C: 8.2 % — AB (ref 4.8–5.6)
Mean Plasma Glucose: 189 mg/dL

## 2016-04-29 MED ORDER — POTASSIUM CHLORIDE CRYS ER 20 MEQ PO TBCR
40.0000 meq | EXTENDED_RELEASE_TABLET | Freq: Once | ORAL | Status: AC
  Administered 2016-04-29: 40 meq via ORAL
  Filled 2016-04-29: qty 2

## 2016-04-29 MED ORDER — MAGNESIUM SULFATE 2 GM/50ML IV SOLN
2.0000 g | Freq: Once | INTRAVENOUS | Status: AC
  Administered 2016-04-29: 2 g via INTRAVENOUS
  Filled 2016-04-29: qty 50

## 2016-04-29 MED ORDER — GABAPENTIN 300 MG PO CAPS
300.0000 mg | ORAL_CAPSULE | Freq: Every day | ORAL | Status: DC
  Filled 2016-04-29 (×2): qty 1

## 2016-04-29 MED ORDER — ALPRAZOLAM 0.5 MG PO TABS
0.5000 mg | ORAL_TABLET | Freq: Three times a day (TID) | ORAL | Status: DC
  Administered 2016-04-29 – 2016-04-30 (×3): 0.5 mg via ORAL
  Filled 2016-04-29 (×3): qty 1

## 2016-04-29 NOTE — Progress Notes (Signed)
PROGRESS NOTE    Doriann Zuch Guerrero  ZOX:[id number] DOB: 10-06-1950 DOA: 04/28/2016 PCP: Billee Cashing, MD   Outpatient Specialists:     Brief Narrative:  Madeline Guerrero is a 66 y.o. female  with past history of diabetes, hypertension, chronic pain and depression presents to the emergency room with chief complaint of severe diarrhea for last 2-3 days. Of note multiple members of the patient's household had C. difficile diarrhea. Patient states she's also had associated chills. Has had several episodes of watery diarrhea. Denies any bloody diarrhea. Patient has nausea but no vomiting. Patient can tolerate by mouth but it makes her have large bouts of diarrhea. This is cause food avoidance. Patient has been taking her home medications except for metformin. Patient denies any abdominal trauma. Patient has not has C. difficile before.   Assessment & Plan:   Principal Problem:   Clostridium difficile diarrhea Active Problems:   Anxiety and depression   Chronic pain syndrome   Uncontrolled type 2 diabetes mellitus with hyperglycemia, without long-term current use of insulin (HCC)   c diff diarrhea -PO vanc -hand hygiene discussed  DM- uncontrolled -SSI -holding PO meds  Anxiety -continue PRN xanax  hypomagnesium/hypokalemia -replete   DVT prophylaxis:  Lovenox   Code Status: Full Code   Family Communication: patient  Disposition Plan:     Consultants:      Subjective: Still with diarrhea and severe cramping Works in a SNF  Objective: Vitals:   04/28/16 1359 04/28/16 2044 04/29/16 0418 04/29/16 1031  BP: (!) 149/56 (!) 154/58 (!) 146/67 (!) 152/59  Pulse: 72 87 89 90  Resp: Temp: 97.8 F (36.6 C) 98.8 F (37.1 C) 97.5 F (36.4 C) 98 F (36.7 C)  TempSrc: Oral Oral Oral Oral  SpO2: 100% 97% 100% 100%  Weight:  99.9 kg (220 lb 3.2 oz)    Height:        Intake/Output Summary (Last 24 hours) at 04/29/16  1106 Last data filed at 04/29/16 1101  Gross per 24 hour  Intake             3458 ml  Output                0 ml  Net             3458 ml   Filed Weights   04/28/16 0824 04/28/16 2044  Weight: 101.2 kg (223 lb) 99.9 kg (220 lb 3.2 oz)    Examination:  General exam: Appears calm and comfortable  Respiratory system: Clear to auscultation. Respiratory effort normal. Cardiovascular system: S1 & S2 heard, RRR. No JVD, murmurs, rubs, gallops or clicks. No pedal edema. Gastrointestinal system: Abdomen is nondistended, soft and nontender. No organomegaly or masses felt. Normal bowel sounds heard. Central nervous system: Alert and oriented. No focal neurological deficits.    Data Reviewed: I have personally reviewed following labs and imaging studies  CBC:  Recent Labs Lab 04/28/16 0855 04/29/16 0408  WBC 10.9* 12.2*  NEUTROABS 7.6  --   HGB 11.6* 11.2*  HCT 33.6* 33.9*  MCV 93.1 95.2  PLT 278 239   Basic Metabolic Panel:  Recent Labs Lab 04/28/16 0855 04/28/16 1519 04/29/16 0408  NA 135  --  138  K 3.4*  --  3.2*  CL 102  --  106  CO2 22  --  20*  GLUCOSE 238*  --  144*  BUN 18  --  9  CREATININE 1.15*  --  1.04*  CALCIUM 9.2  --  8.7*  MG  --  1.4*  --   PHOS  --  3.2  --    GFR: Estimated Creatinine Clearance: 62.8 mL/min (A) (by C-G formula based on SCr of 1.04 mg/dL (H)). Liver Function Tests:  Recent Labs Lab 04/28/16 0855  AST 21  ALT 16  ALKPHOS 81  BILITOT 0.5  PROT 7.4  ALBUMIN 3.7    Recent Labs Lab 04/28/16 0855  LIPASE 32   No results for input(s): AMMONIA in the last 168 hours. Coagulation Profile: No results for input(s): INR, PROTIME in the last 168 hours. Cardiac Enzymes: No results for input(s): CKTOTAL, CKMB, CKMBINDEX, TROPONINI in the last 168 hours. BNP (last 3 results) No results for input(s): PROBNP in the last 8760 hours. HbA1C:  Recent Labs  04/28/16 1519  HGBA1C 8.2*   CBG:  Recent Labs Lab 04/28/16 1705  04/28/16 2040 04/29/16 0044 04/29/16 0412 04/29/16 0759  GLUCAP 156* 142* 83 146* 192*   Lipid Profile: No results for input(s): CHOL, HDL, LDLCALC, TRIG, CHOLHDL, LDLDIRECT in the last 72 hours. Thyroid Function Tests: No results for input(s): TSH, T4TOTAL, FREET4, T3FREE, THYROIDAB in the last 72 hours. Anemia Panel: No results for input(s): VITAMINB12, FOLATE, FERRITIN, TIBC, IRON, RETICCTPCT in the last 72 hours. Urine analysis: No results found for: COLORURINE, APPEARANCEUR, LABSPEC, PHURINE, GLUCOSEU, HGBUR, BILIRUBINUR, KETONESUR, PROTEINUR, UROBILINOGEN, NITRITE, LEUKOCYTESUR   ) Recent Results (from the past 240 hour(s))  C difficile quick scan w PCR reflex     Status: Abnormal   Collection Time: 04/28/16  9:30 AM  Result Value Ref Range Status   C Diff antigen POSITIVE (A) NEGATIVE Final   C Diff toxin POSITIVE (A) NEGATIVE Final   C Diff interpretation Toxin producing C. difficile detected.  Final    Comment: CRITICAL RESULT CALLED TO, READ BACK BY AND VERIFIED WITH: E HOWELL,RN 1031 04/28/16 BY L BENFIELD        Anti-infectives    Start     Dose/Rate Route Frequency Ordered Stop   04/28/16 1145  vancomycin (VANCOCIN) 50 mg/mL oral solution 125 mg     125 mg Oral 4 times daily 04/28/16 1120 05/12/16 0959       Radiology Studies: Ct Abdomen Pelvis W Contrast  Result Date: 04/28/2016 CLINICAL DATA:  Abdominal pain for 2 days. Left lower quadrant centered. EXAM: CT ABDOMEN AND PELVIS WITH CONTRAST TECHNIQUE: Multidetector CT imaging of the abdomen and pelvis was performed using the standard protocol following bolus administration of intravenous contrast. CONTRAST:  ISOVUE-300 IOPAMIDOL (ISOVUE-300) INJECTION 61% COMPARISON:  None. FINDINGS: Lower chest: Clear lung bases. Normal heart size without pericardial or pleural effusion. Hepatobiliary: Normal liver. Cholecystectomy, without biliary ductal dilatation. Pancreas: Normal, without mass or ductal dilatation.  Spleen: Normal in size, without focal abnormality. Adrenals/Urinary Tract: Normal adrenal glands. Normal kidneys, without hydronephrosis. Normal urinary bladder. Stomach/Bowel: Normal stomach, without wall thickening. mild left-sided colonic wall thickening, including on images 40 and 44/series 3. Possible pericolonic edema involving the descending colon. Normal terminal ileum and appendix. Normal small bowel. Vascular/Lymphatic: Aortic and branch vessel atherosclerosis. No abdominopelvic adenopathy. Reproductive: Intrauterine device.  No adnexal mass. Other: No significant free fluid. Musculoskeletal: No acute osseous abnormality. IMPRESSION: 1. Findings suspicious for mild left-sided colitis. Distribution could be infection. Rule out C. difficile infection. Ischemic bowel is felt less likely, given relatively mild aortic and mesenteric atherosclerosis. 2.  Aortic atherosclerosis. Electronically Signed   By: Hosie Spangle.D.  On: 04/28/2016 11:41        Scheduled Meds: . enoxaparin (LOVENOX) injection  40 mg Subcutaneous Q24H  . insulin aspart  0-20 Units Subcutaneous Q4H  . sodium chloride flush  3 mL Intravenous Q12H  . vancomycin  125 mg Oral QID   Continuous Infusions: . sodium chloride 150 mL/hr at 04/29/16 1100     LOS: 0 days    Time spent: 25 min    Bliss Behnke U Jonni Oelkers, DO [facility] Pager [phone]  If 7PM-7AM, please contact night-coverage [email or web address] Password [email or web address] 04/29/2016, 11:06 AM

## 2016-04-30 DIAGNOSIS — N179 Acute kidney failure, unspecified: Secondary | ICD-10-CM

## 2016-04-30 LAB — CBC
HCT: 28.1 % — ABNORMAL LOW (ref 36.0–46.0)
HEMOGLOBIN: 9.6 g/dL — AB (ref 12.0–15.0)
MCH: 32.3 pg (ref 26.0–34.0)
MCHC: 34.2 g/dL (ref 30.0–36.0)
MCV: 94.6 fL (ref 78.0–100.0)
Platelets: 209 10*3/uL (ref 150–400)
RBC: 2.97 MIL/uL — AB (ref 3.87–5.11)
RDW: 13.1 % (ref 11.5–15.5)
WBC: 8.8 10*3/uL (ref 4.0–10.5)

## 2016-04-30 LAB — GLUCOSE, CAPILLARY
GLUCOSE-CAPILLARY: 115 mg/dL — AB (ref 65–99)
GLUCOSE-CAPILLARY: 117 mg/dL — AB (ref 65–99)
GLUCOSE-CAPILLARY: 95 mg/dL (ref 65–99)
Glucose-Capillary: 101 mg/dL — ABNORMAL HIGH (ref 65–99)
Glucose-Capillary: 107 mg/dL — ABNORMAL HIGH (ref 65–99)
Glucose-Capillary: 122 mg/dL — ABNORMAL HIGH (ref 65–99)
Glucose-Capillary: 163 mg/dL — ABNORMAL HIGH (ref 65–99)

## 2016-04-30 LAB — BASIC METABOLIC PANEL
ANION GAP: 9 (ref 5–15)
BUN: 6 mg/dL (ref 6–20)
CHLORIDE: 109 mmol/L (ref 101–111)
CO2: 21 mmol/L — ABNORMAL LOW (ref 22–32)
Calcium: 8.3 mg/dL — ABNORMAL LOW (ref 8.9–10.3)
Creatinine, Ser: 0.89 mg/dL (ref 0.44–1.00)
GFR calc non Af Amer: >60 mL/min (ref >60)
Glucose, Bld: 115 mg/dL — ABNORMAL HIGH (ref 65–99)
POTASSIUM: 3 mmol/L — AB (ref 3.5–5.1)
SODIUM: 139 mmol/L (ref 135–145)

## 2016-04-30 MED ORDER — ALPRAZOLAM 0.5 MG PO TABS
0.5000 mg | ORAL_TABLET | Freq: Three times a day (TID) | ORAL | Status: DC | PRN
  Administered 2016-04-30 – 2016-05-02 (×5): 0.5 mg via ORAL
  Filled 2016-04-30 (×5): qty 1

## 2016-04-30 MED ORDER — MAGNESIUM SULFATE 2 GM/50ML IV SOLN
2.0000 g | Freq: Once | INTRAVENOUS | Status: AC
Start: 2016-04-30 — End: 2016-04-30
  Administered 2016-04-30: 2 g via INTRAVENOUS
  Filled 2016-04-30: qty 50

## 2016-04-30 MED ORDER — POTASSIUM CHLORIDE CRYS ER 20 MEQ PO TBCR
40.0000 meq | EXTENDED_RELEASE_TABLET | Freq: Once | ORAL | Status: AC
  Administered 2016-04-30: 40 meq via ORAL
  Filled 2016-04-30: qty 2

## 2016-04-30 NOTE — Progress Notes (Addendum)
PROGRESS NOTE    Emilija Bohman Guerrero  ZOX:[id number] DOB: Apr 10, 1950 DOA: 04/28/2016 PCP: Billee Cashing, MD   Outpatient Specialists:     Brief Narrative:  Madeline Guerrero is a 66 y.o. female  with past history of diabetes, hypertension, chronic pain and depression presents to the emergency room with chief complaint of severe diarrhea for last 2-3 days. Of note multiple members of the patient's household had C. difficile diarrhea. Patient states she's also had associated chills. Has had several episodes of watery diarrhea. Denies any bloody diarrhea. Patient has nausea but no vomiting. Patient can tolerate by mouth but it makes her have large bouts of diarrhea. This is cause food avoidance. Patient has been taking her home medications except for metformin. Patient denies any abdominal trauma. Patient has not has C. difficile before.   Assessment & Plan:   Principal Problem:   Clostridium difficile diarrhea Active Problems:   Anxiety and depression   Chronic pain syndrome   Uncontrolled type 2 diabetes mellitus with hyperglycemia, without long-term current use of insulin (HCC)   c diff diarrhea -PO vanc- patient still states having mucous-like and liquid BMs- suspect should see improvement in next 24 hours -hand hygiene discussed  Mild AKI -resolved with IVf  DM- uncontrolled -SSI -holding PO meds  Anxiety -continue PRN xanax  hypomagnesium/hypokalemia -replete -recheck in AM   DVT prophylaxis:  Lovenox   Code Status: Full Code   Family Communication: patient  Disposition Plan:     Consultants:      Subjective: Still with cramping and multiple stools after eating  Objective: Vitals:   04/29/16 1846 04/29/16 2130 04/30/16 0509 04/30/16 0900  BP:  136/67 (!) 153/66 (!) 157/80  Pulse:  90 92 90  Resp:  Temp: 99.6 F (37.6 C) 98.8 F (37.1 C) 98.7 F (37.1 C) 98.8 F (37.1 C)  TempSrc:  Oral Oral Oral  SpO2:  99%  100% 99%  Weight:  102.1 kg (225 lb)    Height:        Intake/Output Summary (Last 24 hours) at 04/30/16 1148 Last data filed at 04/30/16 1017  Gross per 24 hour  Intake              520 ml  Output                0 ml  Net              520 ml   Filed Weights   04/28/16 0824 04/28/16 2044 04/29/16 2130  Weight: 101.2 kg (223 lb) 99.9 kg (220 lb 3.2 oz) 102.1 kg (225 lb)    Examination:  General exam: uncomfortable appearing Respiratory system: Clear to auscultation. Respiratory effort normal. Cardiovascular system: S1 & S2 heard, RRR. No JVD, murmurs, rubs, gallops or clicks. No pedal edema. Gastrointestinal system: Abdomen is soft and mildly tender. No organomegaly or masses felt. Normal bowel sounds heard. Central nervous system: Alert and oriented. No focal neurological deficits.    Data Reviewed: I have personally reviewed following labs and imaging studies  CBC:  Recent Labs Lab 04/28/16 0855 04/29/16 0408 04/30/16 0432  WBC 10.9* 12.2* 8.8  NEUTROABS 7.6  --   --   HGB 11.6* 11.2* 9.6*  HCT 33.6* 33.9* 28.1*  MCV 93.1 95.2 94.6  PLT 278 239 209   Basic Metabolic Panel:  Recent Labs Lab 04/28/16 0855 04/28/16 1519 04/29/16 0408 04/30/16 0432  NA 135  --  138 139  K 3.4*  --  3.2* 3.0*  CL 102  --  106 109  CO2 22  --  20* 21*  GLUCOSE 238*  --  144* 115*  BUN 18  --  9 6  CREATININE 1.15*  --  1.04* 0.89  CALCIUM 9.2  --  8.7* 8.3*  MG  --  1.4*  --   --   PHOS  --  3.2  --   --    GFR: Estimated Creatinine Clearance: 74.3 mL/min (by C-G formula based on SCr of 0.89 mg/dL). Liver Function Tests:  Recent Labs Lab 04/28/16 0855  AST 21  ALT 16  ALKPHOS 81  BILITOT 0.5  PROT 7.4  ALBUMIN 3.7    Recent Labs Lab 04/28/16 0855  LIPASE 32   No results for input(s): AMMONIA in the last 168 hours. Coagulation Profile: No results for input(s): INR, PROTIME in the last 168 hours. Cardiac Enzymes: No results for input(s): CKTOTAL, CKMB,  CKMBINDEX, TROPONINI in the last 168 hours. BNP (last 3 results) No results for input(s): PROBNP in the last 8760 hours. HbA1C:  Recent Labs  04/28/16 1519  HGBA1C 8.2*   CBG:  Recent Labs Lab 04/29/16 1713 04/29/16 2136 04/30/16 0024 04/30/16 0515 04/30/16 0814  GLUCAP 84 111* 101* 115* 122*   Lipid Profile: No results for input(s): CHOL, HDL, LDLCALC, TRIG, CHOLHDL, LDLDIRECT in the last 72 hours. Thyroid Function Tests: No results for input(s): TSH, T4TOTAL, FREET4, T3FREE, THYROIDAB in the last 72 hours. Anemia Panel: No results for input(s): VITAMINB12, FOLATE, FERRITIN, TIBC, IRON, RETICCTPCT in the last 72 hours. Urine analysis: No results found for: COLORURINE, APPEARANCEUR, LABSPEC, PHURINE, GLUCOSEU, HGBUR, BILIRUBINUR, KETONESUR, PROTEINUR, UROBILINOGEN, NITRITE, LEUKOCYTESUR   ) Recent Results (from the past 240 hour(s))  C difficile quick scan w PCR reflex     Status: Abnormal   Collection Time: 04/28/16  9:30 AM  Result Value Ref Range Status   C Diff antigen POSITIVE (A) NEGATIVE Final   C Diff toxin POSITIVE (A) NEGATIVE Final   C Diff interpretation Toxin producing C. difficile detected.  Final    Comment: CRITICAL RESULT CALLED TO, READ BACK BY AND VERIFIED WITH: E HOWELL,RN 1031 04/28/16 BY L BENFIELD    Culture, blood (Routine X 2) w Reflex to ID Panel     Status: None (Preliminary result)   Collection Time: 04/28/16  3:30 PM  Result Value Ref Range Status   Specimen Description BLOOD RIGHT ANTECUBITAL  Final   Special Requests IN PEDIATRIC BOTTLE Blood Culture adequate volume  Final   Culture NO GROWTH 2 DAYS  Final   Report Status PENDING  Incomplete  Culture, blood (Routine X 2) w Reflex to ID Panel     Status: None (Preliminary result)   Collection Time: 04/28/16  3:40 PM  Result Value Ref Range Status   Specimen Description BLOOD RIGHT ANTECUBITAL  Final   Special Requests IN PEDIATRIC BOTTLE Blood Culture adequate volume  Final   Culture  NO GROWTH 2 DAYS  Final   Report Status PENDING  Incomplete      Anti-infectives    Start     Dose/Rate Route Frequency Ordered Stop   04/28/16 1145  vancomycin (VANCOCIN) 50 mg/mL oral solution 125 mg     125 mg Oral 4 times daily 04/28/16 1120 05/12/16 0959       Radiology Studies: No results found.      Scheduled Meds: . enoxaparin (LOVENOX) injection  40 mg Subcutaneous  Q24H  . gabapentin  300 mg Oral QHS  . insulin aspart  0-20 Units Subcutaneous Q4H  . sodium chloride flush  3 mL Intravenous Q12H  . vancomycin  125 mg Oral QID   Continuous Infusions:    LOS: 1 day    Time spent: 25 min    Reiner Loewen U Alvie Fowles, DO [facility] Pager [phone]  If 7PM-7AM, please contact night-coverage [email or web address] Password [facility] 04/30/2016, 11:48 AM

## 2016-04-30 NOTE — Progress Notes (Addendum)
Benefit check sent for vanc po liquid and tablet.   S/W ALDEN A. @ [facility] # [phone]   1.VANCOMYCIN 125 MG  COVER- YES  CO-PAY- $ 8.00  TIER- 1 DRUG  PRIOR APPROVAL- NO   2. VANCOCIN 125 MG   COVER- YES  CO-PAY- $ 50.00  TIER- 3 DRUG  PRIOR APPROVAL- NO   PHARMACY : [facility]

## 2016-05-01 DIAGNOSIS — G894 Chronic pain syndrome: Secondary | ICD-10-CM

## 2016-05-01 LAB — GLUCOSE, CAPILLARY
GLUCOSE-CAPILLARY: 146 mg/dL — AB (ref 65–99)
GLUCOSE-CAPILLARY: 194 mg/dL — AB (ref 65–99)
GLUCOSE-CAPILLARY: 218 mg/dL — AB (ref 65–99)
Glucose-Capillary: 150 mg/dL — ABNORMAL HIGH (ref 65–99)
Glucose-Capillary: 137 mg/dL — ABNORMAL HIGH (ref 65–99)

## 2016-05-01 LAB — BASIC METABOLIC PANEL
ANION GAP: 8 (ref 5–15)
BUN: <5 mg/dL — ABNORMAL LOW (ref 6–20)
CALCIUM: 8.5 mg/dL — AB (ref 8.9–10.3)
CO2: 23 mmol/L (ref 22–32)
Chloride: 107 mmol/L (ref 101–111)
Creatinine, Ser: 0.79 mg/dL (ref 0.44–1.00)
Glucose, Bld: 130 mg/dL — ABNORMAL HIGH (ref 65–99)
Potassium: 3.3 mmol/L — ABNORMAL LOW (ref 3.5–5.1)
Sodium: 138 mmol/L (ref 135–145)

## 2016-05-01 LAB — CBC
HEMATOCRIT: 27.9 % — AB (ref 36.0–46.0)
Hemoglobin: 9.3 g/dL — ABNORMAL LOW (ref 12.0–15.0)
MCH: 31.4 pg (ref 26.0–34.0)
MCHC: 33.3 g/dL (ref 30.0–36.0)
MCV: 94.3 fL (ref 78.0–100.0)
PLATELETS: 214 10*3/uL (ref 150–400)
RBC: 2.96 MIL/uL — ABNORMAL LOW (ref 3.87–5.11)
RDW: 12.7 % (ref 11.5–15.5)
WBC: 5.6 10*3/uL (ref 4.0–10.5)

## 2016-05-01 LAB — MAGNESIUM: Magnesium: 1.7 mg/dL (ref 1.7–2.4)

## 2016-05-01 MED ORDER — HYDROCODONE-ACETAMINOPHEN 5-325 MG PO TABS
1.0000 | ORAL_TABLET | Freq: Four times a day (QID) | ORAL | Status: DC | PRN
  Filled 2016-05-01: qty 1

## 2016-05-01 MED ORDER — RISAQUAD PO CAPS
1.0000 | ORAL_CAPSULE | Freq: Every day | ORAL | Status: DC
  Administered 2016-05-01 – 2016-05-02 (×2): 1 via ORAL
  Filled 2016-05-01 (×2): qty 1

## 2016-05-01 MED ORDER — HYDROCODONE-ACETAMINOPHEN 10-325 MG PO TABS
1.0000 | ORAL_TABLET | Freq: Four times a day (QID) | ORAL | Status: DC | PRN
  Administered 2016-05-01 – 2016-05-02 (×3): 1 via ORAL
  Filled 2016-05-01 (×3): qty 1

## 2016-05-01 MED ORDER — POTASSIUM CHLORIDE CRYS ER 20 MEQ PO TBCR
40.0000 meq | EXTENDED_RELEASE_TABLET | Freq: Once | ORAL | Status: AC
  Administered 2016-05-01: 40 meq via ORAL
  Filled 2016-05-01: qty 2

## 2016-05-01 NOTE — Progress Notes (Signed)
Patient refusing to take insulin coverage.

## 2016-05-01 NOTE — Progress Notes (Signed)
This RN was called by the monitor tech as all the telemetry leads are off. Pt was in the sink bathing herself and this RN offered help but pt refused and yelled at me to go out of her room. This RN attempted to talk to the pt the importance of putting back the telemetry but continue to refused and non compliant. Charge nurse was aware. Kirtland Bouchard Schorr (on call) and the monitor tech was notified. Will continue to monitor pt

## 2016-05-01 NOTE — Progress Notes (Signed)
Patient asking for pain meds., and requesting for pain meds to be changed to PO. This RN called MD and pain meds was changed to Norco. Offered the pain med to patient and patient became belligerent and started yelling at this RN, stating " I hope you get C-diff and have this pain", also stated "Keep it I will have my husband bring me in pain meds" "All of you can go to hell". This RN explained that she wasn't appropriate and that I was trying to help and her husband can not bring in her medication. Information relayed to [facility] DD.   Avelina Laine RN

## 2016-05-01 NOTE — Progress Notes (Signed)
Leader Rounding Note:  Patient requested to speak with DD to discuss care concerns from last night's shift. Discussed patients pain medication orders, and other concerns regarding assistance with bathing and other hygiene needs. Patient reported that she did her own bath this morning and had her linen changed by staff.   Patient stated that she feels like "they don't want to give me my pain medicine". Reviewed that patient only has Dilaudid ordered for severe pain. Patient informed that RN was notifying MD for other pain medications and verbalized understanding.   Patient thankful for visit and attention to concerns, and needs.  RN Selena Batten reported that she was awaiting call back for PO pain medication.  Melonee Gerstel, Charlyne Quale

## 2016-05-01 NOTE — Progress Notes (Signed)
PROGRESS NOTE    Madeline Guerrero  GEX:[id number] DOB: 22-Jul-1950 DOA: 04/28/2016 PCP: Billee Cashing, MD   Outpatient Specialists:     Brief Narrative:  Madeline Guerrero is a 66 y.o. female  with past history of diabetes, hypertension, chronic pain and depression presents to the emergency room with chief complaint of severe diarrhea for last 2-3 days. Of note multiple members of the patient's household had C. difficile diarrhea. Patient states she's also had associated chills. Has had several episodes of watery diarrhea. Denies any bloody diarrhea. Patient has nausea but no vomiting. Patient can tolerate by mouth but it makes her have large bouts of diarrhea. This is cause food avoidance. Patient has been taking her home medications except for metformin. Patient denies any abdominal trauma. Patient has not has C. difficile before.   Assessment & Plan:   Principal Problem:   Clostridium difficile diarrhea Active Problems:   Anxiety and depression   Chronic pain syndrome   Uncontrolled type 2 diabetes mellitus with hyperglycemia, without long-term current use of insulin (HCC)   c diff diarrhea -PO vanc- nursing saw stool today-- loose and green- -hand hygiene discussed (all members of family have had c diff in last month) -PO pain meds-- patient did not disclose upon admission that she was on PO pain medications at home -Patient also reports that she is vomiting-- nursing staff was not informed by patient nor have they seen any evidence of vomiting  Mild AKI -resolved with IVF  DM- uncontrolled -SSI -holding PO meds until eating more consistantly  Anxiety -continue PRN xanax  hypomagnesium/hypokalemia -replete -recheck in AM  Anemia -outpatient follow up -no current bleeding   DVT prophylaxis:  Lovenox   Code Status: Full Code   Family Communication: patient  Disposition Plan:     Consultants:      Subjective: Claims to be  vomiting  Objective: Vitals:   04/30/16 0900 04/30/16 1733 04/30/16 2037 05/01/16 0842  BP: (!) 157/80 (!) 136/57 (!) 148/63 (!) 169/71  Pulse: 90 86 92   Resp: Temp: 98.8 F (37.1 C) 97.7 F (36.5 C) 97.5 F (36.4 C) 97.8 F (36.6 C)  TempSrc: Oral Oral Oral Oral  SpO2: 99% 99% 98% 98%  Weight:   102.4 kg (225 lb 12.8 oz)   Height:        Intake/Output Summary (Last 24 hours) at 05/01/16 1439 Last data filed at 05/01/16 1300  Gross per 24 hour  Intake              600 ml  Output                2 ml  Net              598 ml   Filed Weights   04/28/16 2044 04/29/16 2130 04/30/16 2037  Weight: 99.9 kg (220 lb 3.2 oz) 102.1 kg (225 lb) 102.4 kg (225 lb 12.8 oz)    Examination:  General exam: resting, NAD Respiratory system: Clear to auscultation. Respiratory effort normal. Cardiovascular system: S1 & S2 heard, RRR. No JVD, murmurs, rubs, gallops or clicks. No pedal edema. Gastrointestinal system: Abdomen is soft and mildly tender. No organomegaly or masses felt. Normal bowel sounds heard. Central nervous system: Alert and oriented. No focal neurological deficits.    Data Reviewed: I have personally reviewed following labs and imaging studies  CBC:  Recent Labs Lab 04/28/16 0855 04/29/16 0408 04/30/16 0432 05/01/16 0428  WBC  10.9* 12.2* 8.8 5.6  NEUTROABS 7.6  --   --   --   HGB 11.6* 11.2* 9.6* 9.3*  HCT 33.6* 33.9* 28.1* 27.9*  MCV 93.1 95.2 94.6 94.3  PLT 278 239 209 214   Basic Metabolic Panel:  Recent Labs Lab 04/28/16 0855 04/28/16 1519 04/29/16 0408 04/30/16 0432 05/01/16 0428  NA 135  --  138 139 138  K 3.4*  --  3.2* 3.0* 3.3*  CL 102  --  106 109 107  CO2 22  --  20* 21* 23  GLUCOSE 238*  --  144* 115* 130*  BUN 18  --  9 6 <5*  CREATININE 1.15*  --  1.04* 0.89 0.79  CALCIUM 9.2  --  8.7* 8.3* 8.5*  MG  --  1.4*  --   --  1.7  PHOS  --  3.2  --   --   --    GFR: Estimated Creatinine Clearance: 82.8 mL/min (by C-G  formula based on SCr of 0.79 mg/dL). Liver Function Tests:  Recent Labs Lab 04/28/16 0855  AST 21  ALT 16  ALKPHOS 81  BILITOT 0.5  PROT 7.4  ALBUMIN 3.7    Recent Labs Lab 04/28/16 0855  LIPASE 32   No results for input(s): AMMONIA in the last 168 hours. Coagulation Profile: No results for input(s): INR, PROTIME in the last 168 hours. Cardiac Enzymes: No results for input(s): CKTOTAL, CKMB, CKMBINDEX, TROPONINI in the last 168 hours. BNP (last 3 results) No results for input(s): PROBNP in the last 8760 hours. HbA1C:  Recent Labs  04/28/16 1519  HGBA1C 8.2*   CBG:  Recent Labs Lab 04/30/16 1626 04/30/16 2033 05/01/16 0000 05/01/16 0746 05/01/16 1212  GLUCAP 107* 163* 95 146* 150*   Lipid Profile: No results for input(s): CHOL, HDL, LDLCALC, TRIG, CHOLHDL, LDLDIRECT in the last 72 hours. Thyroid Function Tests: No results for input(s): TSH, T4TOTAL, FREET4, T3FREE, THYROIDAB in the last 72 hours. Anemia Panel: No results for input(s): VITAMINB12, FOLATE, FERRITIN, TIBC, IRON, RETICCTPCT in the last 72 hours. Urine analysis: No results found for: COLORURINE, APPEARANCEUR, LABSPEC, PHURINE, GLUCOSEU, HGBUR, BILIRUBINUR, KETONESUR, PROTEINUR, UROBILINOGEN, NITRITE, LEUKOCYTESUR   ) Recent Results (from the past 240 hour(s))  C difficile quick scan w PCR reflex     Status: Abnormal   Collection Time: 04/28/16  9:30 AM  Result Value Ref Range Status   C Diff antigen POSITIVE (A) NEGATIVE Final   C Diff toxin POSITIVE (A) NEGATIVE Final   C Diff interpretation Toxin producing C. difficile detected.  Final    Comment: CRITICAL RESULT CALLED TO, READ BACK BY AND VERIFIED WITH: E HOWELL,RN 1031 04/28/16 BY L BENFIELD    Culture, blood (Routine X 2) w Reflex to ID Panel     Status: None (Preliminary result)   Collection Time: 04/28/16  3:30 PM  Result Value Ref Range Status   Specimen Description BLOOD RIGHT ANTECUBITAL  Final   Special Requests IN PEDIATRIC  BOTTLE Blood Culture adequate volume  Final   Culture NO GROWTH 2 DAYS  Final   Report Status PENDING  Incomplete  Culture, blood (Routine X 2) w Reflex to ID Panel     Status: None (Preliminary result)   Collection Time: 04/28/16  3:40 PM  Result Value Ref Range Status   Specimen Description BLOOD RIGHT ANTECUBITAL  Final   Special Requests IN PEDIATRIC BOTTLE Blood Culture adequate volume  Final   Culture NO GROWTH 2 DAYS  Final   Report Status PENDING  Incomplete      Anti-infectives    Start     Dose/Rate Route Frequency Ordered Stop   04/28/16 1145  vancomycin (VANCOCIN) 50 mg/mL oral solution 125 mg     125 mg Oral 4 times daily 04/28/16 1120 05/12/16 0959       Radiology Studies: No results found.      Scheduled Meds: . enoxaparin (LOVENOX) injection  40 mg Subcutaneous Q24H  . insulin aspart  0-20 Units Subcutaneous Q4H  . sodium chloride flush  3 mL Intravenous Q12H  . vancomycin  125 mg Oral QID   Continuous Infusions:    LOS: 2 days    Time spent: 25 min    Coby Antrobus U Cas Tracz, DO [facility] Pager [phone]  If 7PM-7AM, please contact night-coverage [email or web address] Password [facility] 05/01/2016, 2:39 PM

## 2016-05-02 LAB — GLUCOSE, CAPILLARY
Glucose-Capillary: 189 mg/dL — ABNORMAL HIGH (ref 65–99)
Glucose-Capillary: 159 mg/dL — ABNORMAL HIGH (ref 65–99)
Glucose-Capillary: 159 mg/dL — ABNORMAL HIGH (ref 65–99)

## 2016-05-02 MED ORDER — VANCOMYCIN 50 MG/ML ORAL SOLUTION: 125.0000 mg | Freq: Four times a day (QID) | ORAL | 0 refills | Status: DC

## 2016-05-02 MED ORDER — HYDROCODONE-ACETAMINOPHEN 10-325 MG PO TABS: 1.0000 | ORAL_TABLET | Freq: Four times a day (QID) | ORAL | 0 refills | Status: DC | PRN

## 2016-05-02 MED ORDER — RISAQUAD PO CAPS: 1.0000 | ORAL_CAPSULE | Freq: Every day | ORAL | 0 refills | Status: DC

## 2016-05-02 NOTE — Progress Notes (Signed)
Results for MARIJO, QUIZON (MRN [id number]) as of 05/02/2016 12:48  Ref. Range 05/01/2016 20:21 05/01/2016 23:54 05/02/2016 04:34 05/02/2016 07:37 05/02/2016 11:47  Glucose-Capillary Latest Ref Range: 65 - 99 mg/dL 409 (H) 811 (H) 914 (H) 159 (H) 159 (H)  Noted that Novolog correction scale is ordered for every 4 hours.  If patient is eating, recommend changing Novolog correction scale to TID & HS.   Smith Mince RN BSN CDE Diabetes Coordinator Pager: [phone]  8am-5pm

## 2016-05-02 NOTE — Progress Notes (Signed)
Madeline Guerrero to be D/C'd Home per MD order.  Discussed prescriptions and follow up appointments with the patient. Prescriptions given to patient, medication list explained in detail. Pt verbalized understanding.  Allergies as of 05/02/2016      Reactions   Januvia [sitagliptin] Swelling   Tongue swelling   Onglyza [saxagliptin] Swelling   Tradjenta [linagliptin] Swelling   Tongue and lips   Ultram [tramadol] Nausea And Vomiting   Nsaids Swelling   Robaxin [methocarbamol] Swelling   Swelling in her legs.   Levaquin [levofloxacin] Rash      Medication List    STOP taking these medications   diclofenac sodium 1 % Gel Commonly known as:  VOLTAREN   ibuprofen 600 MG tablet Commonly known as:  ADVIL,MOTRIN   Insulin Detemir 100 UNIT/ML Pen Commonly known as:  LEVEMIR FLEXTOUCH   lisinopril-hydrochlorothiazide 20-25 MG tablet Commonly known as:  PRINZIDE,ZESTORETIC   methocarbamol 500 MG tablet Commonly known as:  ROBAXIN   PARoxetine 20 MG tablet Commonly known as:  PAXIL   tiZANidine 4 MG capsule Commonly known as:  ZANAFLEX     TAKE these medications   acidophilus Caps capsule Take 1 capsule by mouth daily.   ALPRAZolam 0.5 MG tablet Commonly known as:  XANAX Take 0.5 mg by mouth 3 (three) times daily.   gabapentin 300 MG capsule Commonly known as:  NEURONTIN TAKE 1 CAPSULE BY MOUTH AT BEDTIME What changed:  Another medication with the same name was removed. Continue taking this medication, and follow the directions you see here.   glipiZIDE 10 MG 24 hr tablet Commonly known as:  GLUCOTROL XL Take 1 tablet (10 mg total) by mouth daily with breakfast.   HYDROcodone-acetaminophen 10-325 MG tablet Commonly known as:  NORCO Take 1 tablet by mouth every 6 (six) hours as needed for moderate pain.   metFORMIN 1000 MG tablet Commonly known as:  GLUCOPHAGE Take 1 tablet (1,000 mg total) by mouth 2 (two) times daily with a meal.   pravastatin 20 MG  tablet Commonly known as:  PRAVACHOL Take 1 tablet daily What changed:  how much to take  how to take this  when to take this  additional instructions   vancomycin 50 mg/mL oral solution Commonly known as:  VANCOCIN Take 2.5 mLs (125 mg total) by mouth 4 (four) times daily.       Vitals:   05/02/16 0438 05/02/16 0824  BP: (!) 115/54 125/67  Pulse: 64 72  Resp: 18 18  Temp: 97.8 F (36.6 C) 98.4 F (36.9 C)    Skin clean, dry and intact without evidence of skin break down, no evidence of skin tears noted. IV catheter discontinued intact. Site without signs and symptoms of complications. Dressing and pressure applied. Pt denies pain at this time. No complaints noted.  An After Visit Summary was printed and given to the patient. Patient escorted via WC, and D/C home via private auto.  Britt Bolognese RN, BSN

## 2016-05-02 NOTE — Discharge Instructions (Signed)
[facility]  [phone] Call this number to find local doctors accepting new patients .  Call the number on your insurance card to learn the names of doctors accepting your insurance.

## 2016-05-02 NOTE — Discharge Summary (Signed)
Physician Discharge Summary  Madeline Guerrero ZOX:[id number] DOB: 08-24-1950 DOA: 04/28/2016  PCP: Billee Cashing, MD  Admit date: 04/28/2016 Discharge date: 05/02/2016   Recommendations for Outpatient Follow-Up:   Patient requesting pain medications and benzos be prescribed Outpatient anemia work up   Discharge Diagnosis:   Principal Problem:   Clostridium difficile diarrhea Active Problems:   Anxiety and depression   Chronic pain syndrome   Uncontrolled type 2 diabetes mellitus with hyperglycemia, without long-term current use of insulin [facility])   Discharge disposition:  Home.:  Discharge Condition: Improved.  Diet recommendation: Low sodium, heart healthy.  Carbohydrate-modified.  Wound care: None.   History of Present Illness:   Madeline Guerrero is a 66 y.o. female  with past history of diabetes, hypertension, chronic pain and depression presents to the emergency room with chief complaint of severe diarrhea for last 2-3 days. Of note multiple members of the patient's household had C. difficile diarrhea. Patient states she's also had associated chills. Has had several episodes of watery diarrhea. Denies any bloody diarrhea. Patient has nausea but no vomiting. Patient can tolerate by mouth but it makes her have large bouts of diarrhea. This is cause food avoidance. Patient has been taking her home medications except for metformin. Patient denies any abdominal trauma. Patient has not has C. difficile before.   Hospital Course by Problem:   c diff diarrhea -PO vanc x 14 days -hand hygiene discussed (all members of family have had c diff in last month) -PO pain meds-- patient did not disclose upon admission that she was on PO pain medications at home- gave 3 days in prescription  Mild AKI -resolved with IVF  DM- uncontrolled -resume home meds  Anxiety -continue PRN xanax (patient recently filled script and should have medications at  home)  hypomagnesium/hypokalemia -repleted  Anemia -outpatient follow up -no current bleeding    Medical Consultants:    None.   Discharge Exam:   Vitals:   05/02/16 0438 05/02/16 0824  BP: (!) 115/54 125/67  Pulse: 64 72  Resp: 18 18  Temp: 97.8 F (36.6 C) 98.4 F (36.9 C)   Vitals:   05/01/16 1819 05/01/16 2026 05/02/16 0438 05/02/16 0824  BP: (!) 154/66 (!) 142/54 (!) 115/54 125/67  Pulse:  75 64 72  Resp: Temp: 97.5 F (36.4 C)  97.8 F (36.6 C) 98.4 F (36.9 C)  TempSrc: Oral Oral Oral Oral  SpO2: 98% 99% 100% 100%  Weight:  102.2 kg (225 lb 3.2 oz)    Height:        Gen:  NAD    The results of significant diagnostics from this hospitalization (including imaging, microbiology, ancillary and laboratory) are listed below for reference.     Procedures and Diagnostic Studies:   Ct Abdomen Pelvis W Contrast  Result Date: 04/28/2016 CLINICAL DATA:  Abdominal pain for 2 days. Left lower quadrant centered. EXAM: CT ABDOMEN AND PELVIS WITH CONTRAST TECHNIQUE: Multidetector CT imaging of the abdomen and pelvis was performed using the standard protocol following bolus administration of intravenous contrast. CONTRAST:  ISOVUE-300 IOPAMIDOL (ISOVUE-300) INJECTION 61% COMPARISON:  None. FINDINGS: Lower chest: Clear lung bases. Normal heart size without pericardial or pleural effusion. Hepatobiliary: Normal liver. Cholecystectomy, without biliary ductal dilatation. Pancreas: Normal, without mass or ductal dilatation. Spleen: Normal in size, without focal abnormality. Adrenals/Urinary Tract: Normal adrenal glands. Normal kidneys, without hydronephrosis. Normal urinary bladder. Stomach/Bowel: Normal stomach, without wall thickening. mild left-sided colonic wall thickening,  including on images 40 and 44/series 3. Possible pericolonic edema involving the descending colon. Normal terminal ileum and appendix. Normal small bowel. Vascular/Lymphatic: Aortic  and branch vessel atherosclerosis. No abdominopelvic adenopathy. Reproductive: Intrauterine device.  No adnexal mass. Other: No significant free fluid. Musculoskeletal: No acute osseous abnormality. IMPRESSION: 1. Findings suspicious for mild left-sided colitis. Distribution could be infection. Rule out C. difficile infection. Ischemic bowel is felt less likely, given relatively mild aortic and mesenteric atherosclerosis. 2.  Aortic atherosclerosis. Electronically Signed   By: Jeronimo Greaves M.D.   On: 04/28/2016 11:41     Labs:   Basic Metabolic Panel:  Recent Labs Lab 04/28/16 0855 04/28/16 1519 04/29/16 0408 04/30/16 0432 05/01/16 0428  NA 135  --  138 139 138  K 3.4*  --  3.2* 3.0* 3.3*  CL 102  --  106 109 107  CO2 22  --  20* 21* 23  GLUCOSE 238*  --  144* 115* 130*  BUN 18  --  9 6 <5*  CREATININE 1.15*  --  1.04* 0.89 0.79  CALCIUM 9.2  --  8.7* 8.3* 8.5*  MG  --  1.4*  --   --  1.7  PHOS  --  3.2  --   --   --    GFR Estimated Creatinine Clearance: 82.7 mL/min (by C-G formula based on SCr of 0.79 mg/dL). Liver Function Tests:  Recent Labs Lab 04/28/16 0855  AST 21  ALT 16  ALKPHOS 81  BILITOT 0.5  PROT 7.4  ALBUMIN 3.7    Recent Labs Lab 04/28/16 0855  LIPASE 32   No results for input(s): AMMONIA in the last 168 hours. Coagulation profile No results for input(s): INR, PROTIME in the last 168 hours.  CBC:  Recent Labs Lab 04/28/16 0855 04/29/16 0408 04/30/16 0432 05/01/16 0428  WBC 10.9* 12.2* 8.8 5.6  NEUTROABS 7.6  --   --   --   HGB 11.6* 11.2* 9.6* 9.3*  HCT 33.6* 33.9* 28.1* 27.9*  MCV 93.1 95.2 94.6 94.3  PLT 278 239 209 214   Cardiac Enzymes: No results for input(s): CKTOTAL, CKMB, CKMBINDEX, TROPONINI in the last 168 hours. BNP: Invalid input(s): POCBNP CBG:  Recent Labs Lab 05/01/16 1710 05/01/16 2021 05/01/16 2354 05/02/16 0434 05/02/16 0737  GLUCAP 137* 218* 194* 189* 159*   D-Dimer No results for input(s): DDIMER in  the last 72 hours. Hgb A1c No results for input(s): HGBA1C in the last 72 hours. Lipid Profile No results for input(s): CHOL, HDL, LDLCALC, TRIG, CHOLHDL, LDLDIRECT in the last 72 hours. Thyroid function studies No results for input(s): TSH, T4TOTAL, T3FREE, THYROIDAB in the last 72 hours.  Invalid input(s): FREET3 Anemia work up No results for input(s): VITAMINB12, FOLATE, FERRITIN, TIBC, IRON, RETICCTPCT in the last 72 hours. Microbiology Recent Results (from the past 240 hour(s))  C difficile quick scan w PCR reflex     Status: Abnormal   Collection Time: 04/28/16  9:30 AM  Result Value Ref Range Status   C Diff antigen POSITIVE (A) NEGATIVE Final   C Diff toxin POSITIVE (A) NEGATIVE Final   C Diff interpretation Toxin producing C. difficile detected.  Final    Comment: CRITICAL RESULT CALLED TO, READ BACK BY AND VERIFIED WITH: E HOWELL,RN 1031 04/28/16 BY L BENFIELD    Culture, blood (Routine X 2) w Reflex to ID Panel     Status: None (Preliminary result)   Collection Time: 04/28/16  3:30 PM  Result Value  Ref Range Status   Specimen Description BLOOD RIGHT ANTECUBITAL  Final   Special Requests IN PEDIATRIC BOTTLE Blood Culture adequate volume  Final   Culture NO GROWTH 3 DAYS  Final   Report Status PENDING  Incomplete  Culture, blood (Routine X 2) w Reflex to ID Panel     Status: None (Preliminary result)   Collection Time: 04/28/16  3:40 PM  Result Value Ref Range Status   Specimen Description BLOOD RIGHT ANTECUBITAL  Final   Special Requests IN PEDIATRIC BOTTLE Blood Culture adequate volume  Final   Culture NO GROWTH 3 DAYS  Final   Report Status PENDING  Incomplete     Discharge Instructions:   Discharge Instructions    Diet - low sodium heart healthy    Complete by:  As directed    Diet Carb Modified    Complete by:  As directed    Discharge instructions    Complete by:  As directed    Check BP at home, resume when BP meds when SBP >140   Increase activity  slowly    Complete by:  As directed      Allergies as of 05/02/2016      Reactions   Januvia [sitagliptin] Swelling   Tongue swelling   Onglyza [saxagliptin] Swelling   Tradjenta [linagliptin] Swelling   Tongue and lips   Ultram [tramadol] Nausea And Vomiting   Nsaids Swelling   Robaxin [methocarbamol] Swelling   Swelling in her legs.   Levaquin [levofloxacin] Rash      Medication List    STOP taking these medications   diclofenac sodium 1 % Gel Commonly known as:  VOLTAREN   ibuprofen 600 MG tablet Commonly known as:  ADVIL,MOTRIN   Insulin Detemir 100 UNIT/ML Pen Commonly known as:  LEVEMIR FLEXTOUCH   lisinopril-hydrochlorothiazide 20-25 MG tablet Commonly known as:  PRINZIDE,ZESTORETIC   methocarbamol 500 MG tablet Commonly known as:  ROBAXIN   PARoxetine 20 MG tablet Commonly known as:  PAXIL   tiZANidine 4 MG capsule Commonly known as:  ZANAFLEX     TAKE these medications   acidophilus Caps capsule Take 1 capsule by mouth daily.   ALPRAZolam 0.5 MG tablet Commonly known as:  XANAX Take 0.5 mg by mouth 3 (three) times daily.   gabapentin 300 MG capsule Commonly known as:  NEURONTIN TAKE 1 CAPSULE BY MOUTH AT BEDTIME What changed:  Another medication with the same name was removed. Continue taking this medication, and follow the directions you see here.   glipiZIDE 10 MG 24 hr tablet Commonly known as:  GLUCOTROL XL Take 1 tablet (10 mg total) by mouth daily with breakfast.   HYDROcodone-acetaminophen 10-325 MG tablet Commonly known as:  NORCO Take 1 tablet by mouth every 6 (six) hours as needed for moderate pain.   metFORMIN 1000 MG tablet Commonly known as:  GLUCOPHAGE Take 1 tablet (1,000 mg total) by mouth 2 (two) times daily with a meal.   pravastatin 20 MG tablet Commonly known as:  PRAVACHOL Take 1 tablet daily What changed:  how much to take  how to take this  when to take this  additional instructions   vancomycin 50 mg/mL  oral solution Commonly known as:  VANCOCIN Take 2.5 mLs (125 mg total) by mouth 4 (four) times daily.      Follow-up Information    Patient to establish with PCP Follow up.            Time coordinating discharge: 2  min  Signed:  Suhaylah Wampole U Lemuel Boodram   [facility] 05/02/2016, 8:30 AM

## 2016-05-02 NOTE — Progress Notes (Signed)
Called to speak to patient due to patient having concerns about discharge mediations and concerns with care on 05/01/16. Patient reported that she is on Xanax prior to admission and that she does not take Neurontin. Informed patient that the MD will not be able to provide a prescription for the Xanax due to it being a medication that she was on prior to admission. Informed patient that she must contact her PCP for a refill of the medication. Patient reports that she does not currently have a PCP as her insurance will no longer pay for her to see Dr. Thea Silversmith.Encouraged patient to contact insurance company at the number on the back of insurance card to determine which MDs would be in network for her in the area. Patient reports that this will take 2 weeks and she has no Xanax so she will go without.   Patient then reported some concerns regarding missed dose of insulin and lovenox on yesterday. Stated that she did not refuse the medications, but that she did not receive them from the nurse.   Also requested the number for medical records so that she could request her medical record following discharge. Number provided. Also requested the mailing address for the hospital administrator. Address provided.   Joette Schmoker, Charlyne Quale

## 2016-05-02 NOTE — Progress Notes (Signed)
Patients boss is requesting that the patient be out of work until May 17, 2015. Letter that MD wrote states May 14, 2015 to return to work. MD notified. Also, patient is requesting prescription for xanax. MD made aware. Benjamine Mola, MD stated that she was unable to write a prescription for xanax since it was her home med and that she would need to follow up with her PCP. Also, Benjamine Mola, MD stated that she would fix the letter to May 17, 2015.

## 2016-05-04 LAB — CULTURE, BLOOD (ROUTINE X 2)
Culture: NO GROWTH
Culture: NO GROWTH
SPECIAL REQUESTS: ADEQUATE
Special Requests: ADEQUATE

## 2016-05-05 ENCOUNTER — Other Ambulatory Visit (INDEPENDENT_AMBULATORY_CARE_PROVIDER_SITE_OTHER): Payer: Self-pay | Admitting: Orthopaedic Surgery

## 2016-05-06 NOTE — Telephone Encounter (Signed)
Please advise 

## 2016-05-17 ENCOUNTER — Other Ambulatory Visit (INDEPENDENT_AMBULATORY_CARE_PROVIDER_SITE_OTHER): Payer: Self-pay

## 2016-05-17 MED ORDER — GABAPENTIN 300 MG PO CAPS: 300.0000 mg | ORAL_CAPSULE | Freq: Every day | ORAL | 0 refills | Status: DC

## 2016-06-07 ENCOUNTER — Other Ambulatory Visit (INDEPENDENT_AMBULATORY_CARE_PROVIDER_SITE_OTHER): Payer: Self-pay | Admitting: Orthopaedic Surgery

## 2016-06-11 NOTE — Telephone Encounter (Signed)
Please advise 

## 2016-07-20 IMAGING — MR MR KNEE*R* W/O CM
4 of 7 series · 19 of 40 positions shown · non-contrast
Comparison: None.

CLINICAL DATA: Right medial knee pain and swelling. Numbness for 3
months. No known injury.

EXAM:
MRI OF THE RIGHT KNEE WITHOUT CONTRAST
TECHNIQUE: Multiplanar, multisequence MR imaging of the knee was performed. No
intravenous contrast was administered.

[Series 3: PD fat-sat · axial · 4.0mm · 0.29mm/px · z∈[-23,+92]mm · 6 of 24 slices shown (1 of 4)]
[im 1/24]
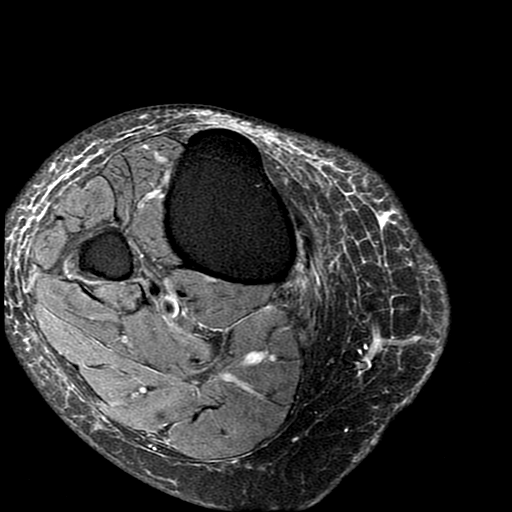
[im 5/24]
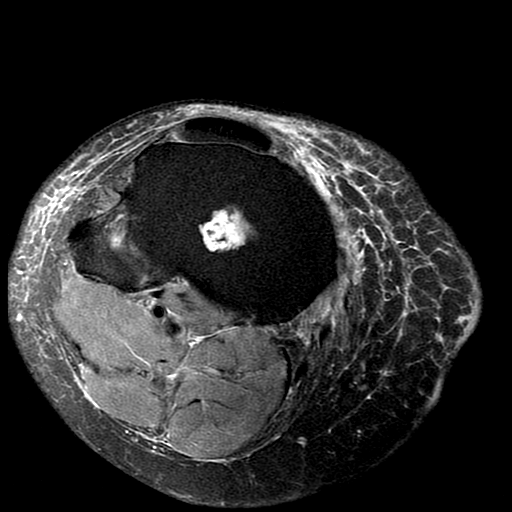
[im 10/24]
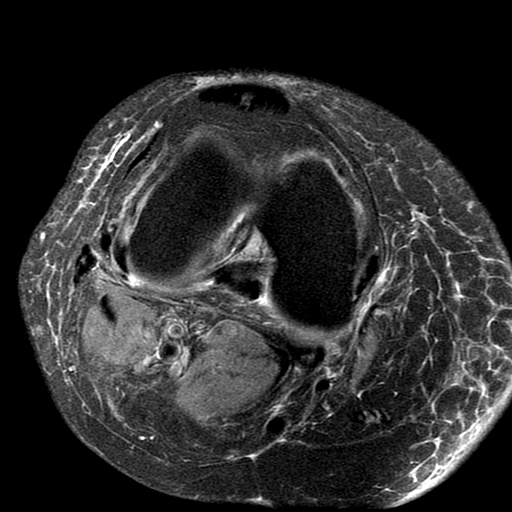
[im 14/24]
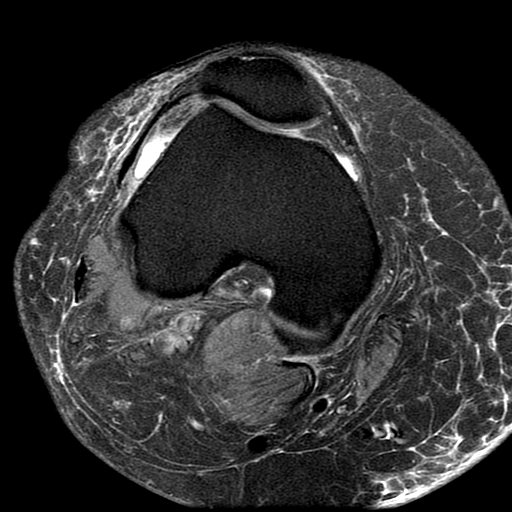
[im 19/24]
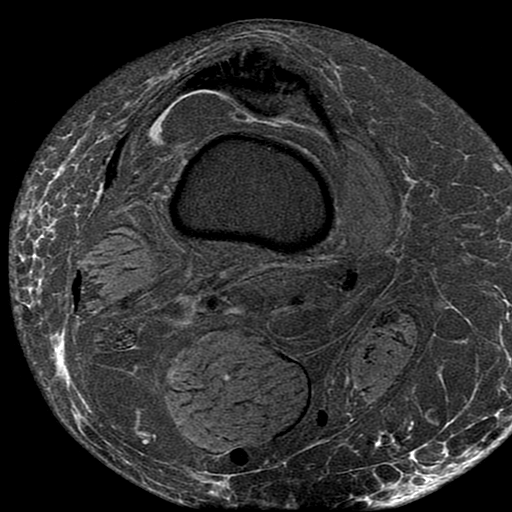
[im 24/24]
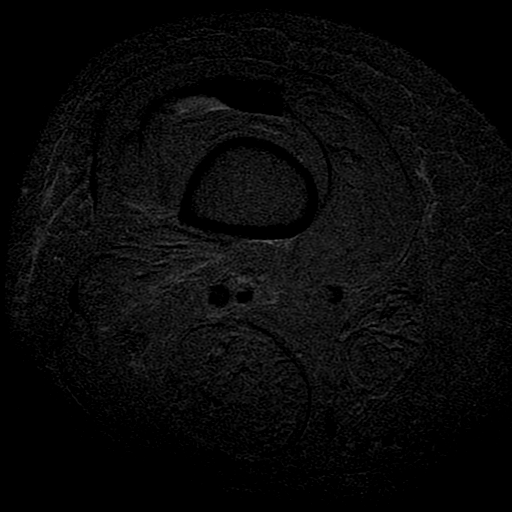

[Series 4: PD fat-sat · sagittal · 3.0mm · 0.27mm/px · 7 of 26 slices shown (2 of 4)]
[im 1/26]
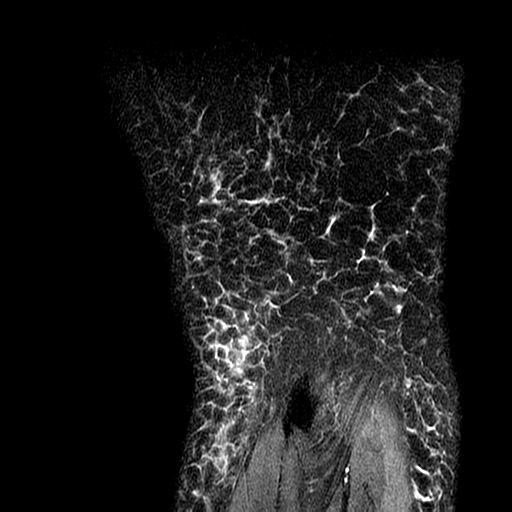
[im 5/26]
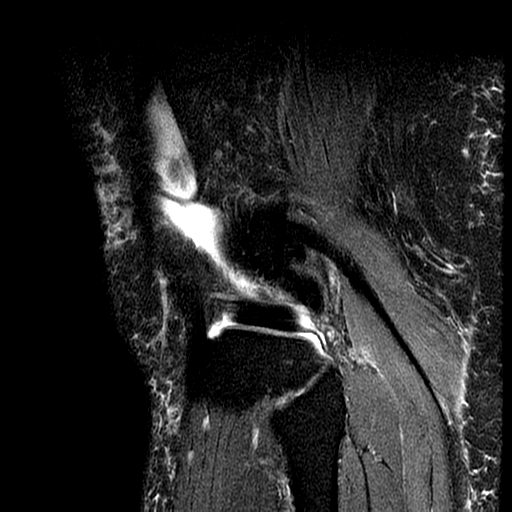
[im 9/26]
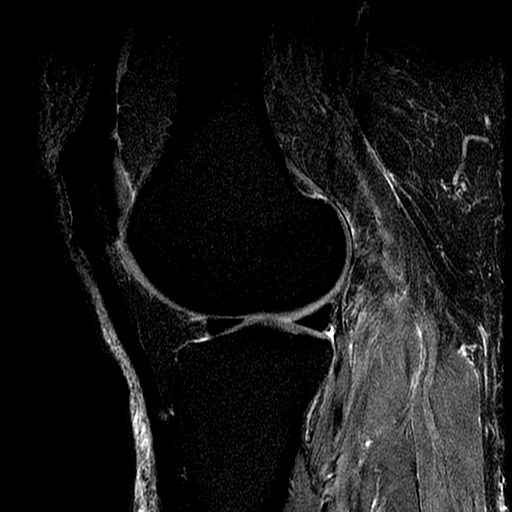
[im 13/26]
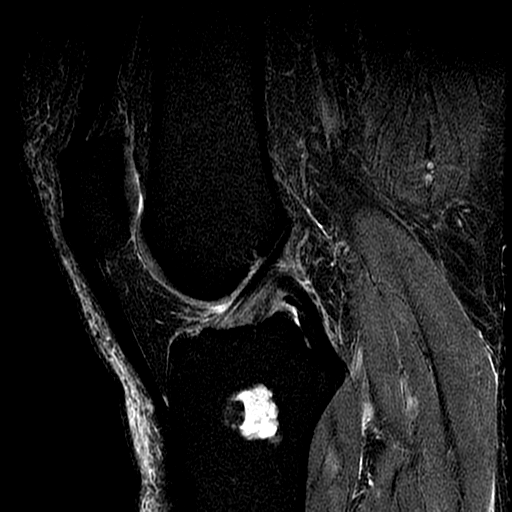
[im 17/26]
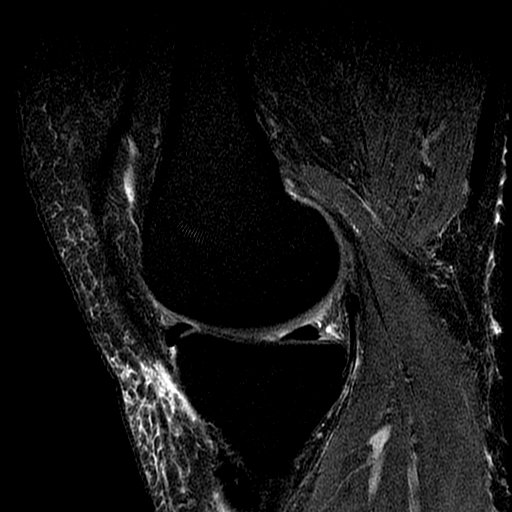
[im 21/26]
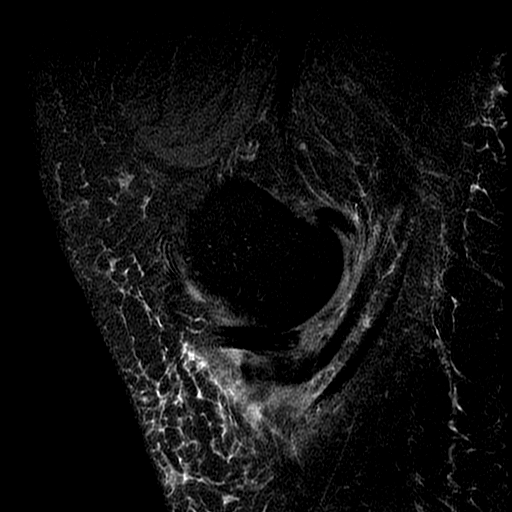
[im 26/26]
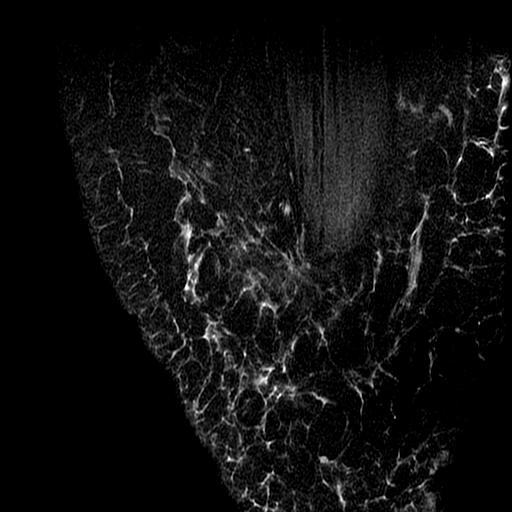

[Series 7: PD fat-sat · coronal · 4.0mm · 0.31mm/px · 3 of 24 slices shown (3 of 4)]
[im 5/24]
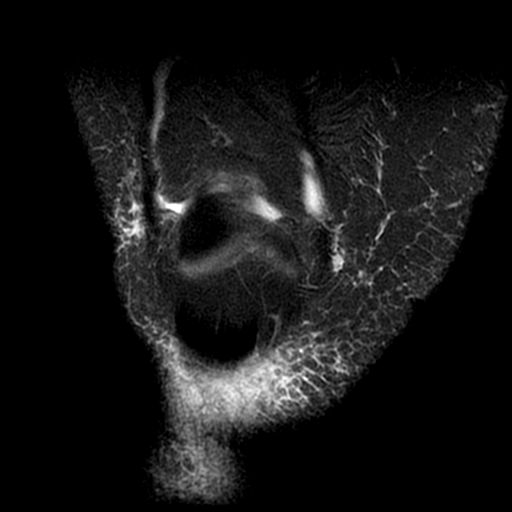
[im 14/24]
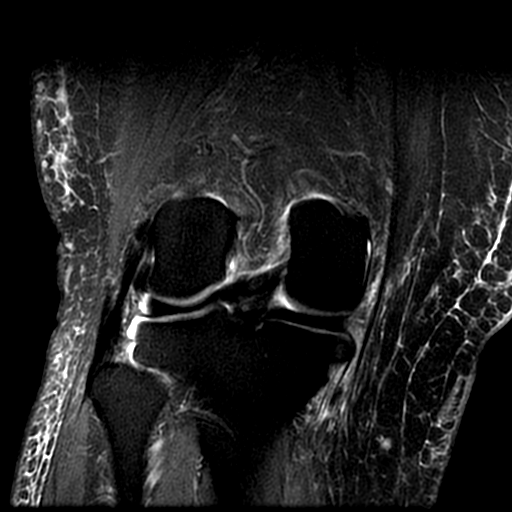
[im 24/24]
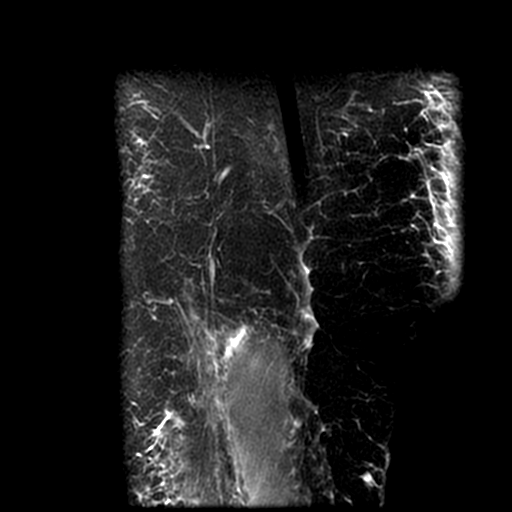

[Series 8: PD fat-sat · coronal · 2.0mm · 0.31mm/px · 3 of 13 slices shown (4 of 4)]
[im 1/13]
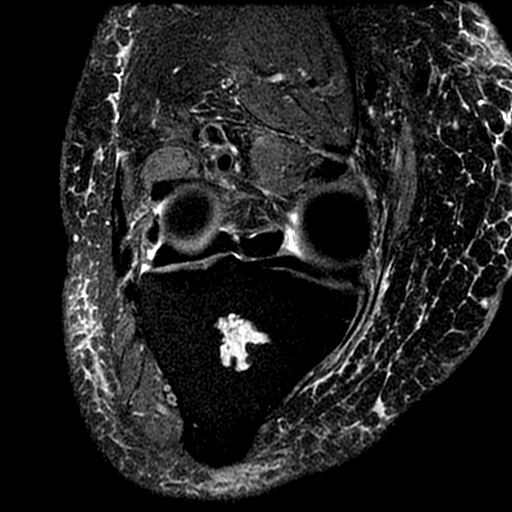
[im 7/13]
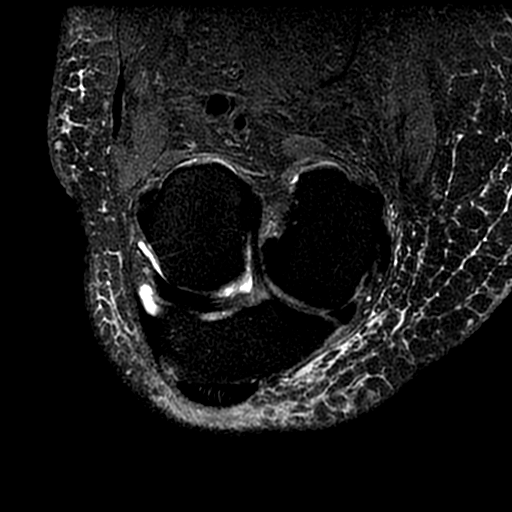
[im 13/13]
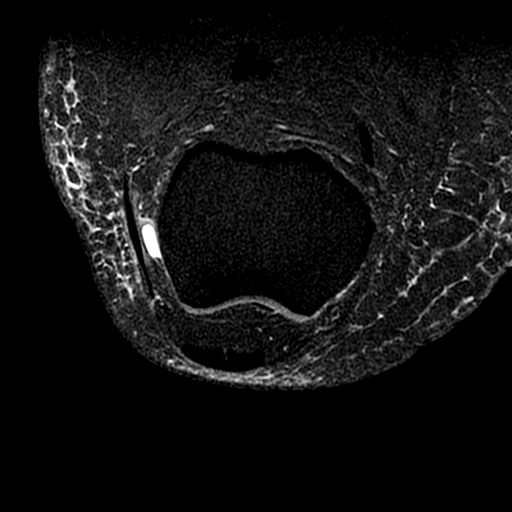

[19 of 40 positions shown; findings below may reference images not displayed]

FINDINGS: MENISCI

Medial meniscus: Small horizontal tear of the posterior horn- body
junction of the medial meniscus extending to the inferior articular
surface.

Lateral meniscus:  Intact.

LIGAMENTS

Cruciates:  Intact ACL and PCL.

Collaterals: Medial collateral ligament is intact. Lateral
collateral ligament complex is intact.

CARTILAGE

Patellofemoral: Small cartilage fissure in the lateral patellar
facet.

Medial:  No focal chondral defect.

Lateral:  No focal chondral defect.

Joint: No joint effusion. Normal Hoffa's fat. No plical thickening.

Popliteal Fossa:  No Baker cyst.  Intact popliteus tendon.

Extensor Mechanism:  Intact.

Bones: 15 mm T2 hyperintense macro lobulated lesion in the proximal
tibial metaphysis with stippled areas of low signal within the
lesion consistent with a benign enchondroma. No surrounding marrow
edema. No bone destruction. No other marrow signal abnormality. No
fracture or dislocation.
IMPRESSION: 1. Small horizontal tear of the posterior horn- body junction of the
medial meniscus extending to the inferior articular surface.
2. Small cartilage fissure in the lateral patellar facet.

## 2016-08-05 ENCOUNTER — Other Ambulatory Visit: Payer: Self-pay | Admitting: Internal Medicine

## 2016-08-05 DIAGNOSIS — Z1231 Encounter for screening mammogram for malignant neoplasm of breast: Secondary | ICD-10-CM

## 2016-08-07 ENCOUNTER — Encounter: Payer: Self-pay | Admitting: Gastroenterology

## 2016-08-07 ENCOUNTER — Other Ambulatory Visit: Payer: Self-pay | Admitting: Internal Medicine

## 2016-08-07 DIAGNOSIS — E2839 Other primary ovarian failure: Secondary | ICD-10-CM

## 2016-08-08 ENCOUNTER — Ambulatory Visit
Admission: RE | Admit: 2016-08-08 | Discharge: 2016-08-08 | Disposition: A | Discharge status: Home / Self Care | Payer: Medicare Other | Source: Ambulatory Visit | Attending: Internal Medicine | Admitting: Internal Medicine

## 2016-08-08 DIAGNOSIS — Z1231 Encounter for screening mammogram for malignant neoplasm of breast: Secondary | ICD-10-CM

## 2016-09-12 ENCOUNTER — Other Ambulatory Visit (INDEPENDENT_AMBULATORY_CARE_PROVIDER_SITE_OTHER): Payer: Self-pay | Admitting: Orthopaedic Surgery

## 2016-09-12 NOTE — Telephone Encounter (Signed)
Please advise 

## 2016-09-17 ENCOUNTER — Ambulatory Visit
Admission: RE | Admit: 2016-09-17 | Discharge: 2016-09-17 | Disposition: A | Discharge status: Home / Self Care | Payer: Medicare Other | Source: Ambulatory Visit | Attending: Internal Medicine | Admitting: Internal Medicine

## 2016-09-17 DIAGNOSIS — E2839 Other primary ovarian failure: Secondary | ICD-10-CM

## 2016-09-24 ENCOUNTER — Ambulatory Visit (AMBULATORY_SURGERY_CENTER): Payer: Self-pay

## 2016-09-24 VITALS — Ht 62.0 in | Wt 225.0 lb

## 2016-09-24 DIAGNOSIS — Z1211 Encounter for screening for malignant neoplasm of colon: Secondary | ICD-10-CM

## 2016-09-24 MED ORDER — NA SULFATE-K SULFATE-MG SULF 17.5-3.13-1.6 GM/177ML PO SOLN: 1.0000 | Freq: Once | ORAL | 0 refills | Status: AC

## 2016-09-24 NOTE — Progress Notes (Signed)
Denies allergies to eggs or soy products. Denies complication of anesthesia or sedation. Denies use of weight loss medication. Denies use of O2.   Emmi instructions declined.    Patient is taking Phentermine and was supposed to stop taking it on 09/21/16. Patient had rescheduled PV and was not told until today. Patient took last dose of Phentermine on 09/24/16. Josh Monday was called and he said that it would be ok to proceed with her scheduled colonoscopy on 10/01/16. Patient verbalizes understanding to stop Phentermine until after her procedure.   Janalee DaneNancy Allex [place], PV

## 2016-10-01 ENCOUNTER — Ambulatory Visit (AMBULATORY_SURGERY_CENTER): Payer: Medicare Other | Admitting: Gastroenterology

## 2016-10-01 ENCOUNTER — Encounter: Payer: Self-pay | Admitting: Gastroenterology

## 2016-10-01 VITALS — BP 119/78 | HR 66 | Temp 97.5°F | Resp 11 | Ht 62.0 in | Wt 225.0 lb

## 2016-10-01 DIAGNOSIS — Z1212 Encounter for screening for malignant neoplasm of rectum: Secondary | ICD-10-CM | POA: Diagnosis not present

## 2016-10-01 DIAGNOSIS — Z1211 Encounter for screening for malignant neoplasm of colon: Secondary | ICD-10-CM | POA: Diagnosis not present

## 2016-10-01 MED ORDER — SODIUM CHLORIDE 0.9 % IV SOLN: 500.0000 mL | INTRAVENOUS | Status: AC

## 2016-10-01 NOTE — Progress Notes (Signed)
Spontaneous respirations throughout. VSS. Resting comfortably. To PACU on room air. Report to  RN. 

## 2016-10-01 NOTE — Op Note (Signed)
[facility] Patient Name: Madeline Guerrero Procedure Date: 10/01/2016 7:59 AM MRN: [id number] Endoscopist: Sherilyn Cooter L. Myrtie Neither , MD Age: 66 Referring MD:  Date of Birth: Oct 24, 1950 Gender: Female Account #: [id number] Procedure:                Colonoscopy Indications:              Screening for colorectal malignant neoplasm (no                            polyps on 06/2003 colonoscopy) Medicines:                Monitored Anesthesia Care Procedure:                Pre-Anesthesia Assessment:                           - Prior to the procedure, a History and Physical                            was performed, and patient medications and                            allergies were reviewed. The patient's tolerance of                            previous anesthesia was also reviewed. The risks                            and benefits of the procedure and the sedation                            options and risks were discussed with the patient.                            All questions were answered, and informed consent                            was obtained. Prior Anticoagulants: The patient has                            taken no previous anticoagulant or antiplatelet                            agents. ASA Grade Assessment: II - A patient with                            mild systemic disease. After reviewing the risks                            and benefits, the patient was deemed in                            satisfactory condition to undergo the procedure.  After obtaining informed consent, the colonoscope                            was passed under direct vision. Throughout the                            procedure, the patient's blood pressure, pulse, and                            oxygen saturations were monitored continuously. The                            Model CF-HQ190L [id number]) scope was introduced                            through the anus and  advanced to the the cecum,                            identified by appendiceal orifice and ileocecal                            valve. The colonoscopy was performed without                            difficulty. The patient tolerated the procedure                            well. The quality of the bowel preparation was                            excellent. The ileocecal valve, appendiceal                            orifice, and rectum were photographed. The quality                            of the bowel preparation was evaluated using the                            BBPS [facility] Bowel Preparation Scale) with scores                            of: Right Colon = 3, Transverse Colon = 3 and Left                            Colon = 3 (entire mucosa seen well with no residual                            staining, small fragments of stool or opaque                            liquid). The total BBPS score equals 9. The bowel  preparation used was SUPREP. Scope In: 8:07:33 AM Scope Out: 8:20:09 AM Scope Withdrawal Time: 0 hours 10 minutes 48 seconds  Total Procedure Duration: 0 hours 12 minutes 36 seconds  Findings:                 The perianal and digital rectal examinations were                            normal.                           The entire examined colon appeared normal on direct                            and retroflexion views. Complications:            No immediate complications. Estimated Blood Loss:     Estimated blood loss: none. Impression:               - The entire examined colon is normal on direct and                            retroflexion views.                           - No specimens collected. Recommendation:           - Patient has a contact number available for                            emergencies. The signs and symptoms of potential                            delayed complications were discussed with the                             patient. Return to normal activities tomorrow.                            Written discharge instructions were provided to the                            patient.                           - Resume previous diet.                           - Continue present medications.                           - Repeat colonoscopy in 10 years for screening                            purposes. Henry L. Myrtie Neither, MD 10/01/2016 8:28:02 AM This report has been signed electronically.

## 2016-10-01 NOTE — Patient Instructions (Signed)
YOU HAD AN ENDOSCOPIC PROCEDURE TODAY AT THE [facility]:   Refer to the procedure report that was given to you for any specific questions about what was found during the examination.  If the procedure report does not answer your questions, please call your gastroenterologist to clarify.  If you requested that your care partner not be given the details of your procedure findings, then the procedure report has been included in a sealed envelope for you to review at your convenience later.  YOU SHOULD EXPECT: Some feelings of bloating in the abdomen. Passage of more gas than usual.  Walking can help get rid of the air that was put into your GI tract during the procedure and reduce the bloating. If you had a lower endoscopy (such as a colonoscopy or flexible sigmoidoscopy) you may notice spotting of blood in your stool or on the toilet paper. If you underwent a bowel prep for your procedure, you may not have a normal bowel movement for a few days.  Please Note:  You might notice some irritation and congestion in your nose or some drainage.  This is from the oxygen used during your procedure.  There is no need for concern and it should clear up in a day or so.  SYMPTOMS TO REPORT IMMEDIATELY:   Following lower endoscopy (colonoscopy or flexible sigmoidoscopy):  Excessive amounts of blood in the stool  Significant tenderness or worsening of abdominal pains  Swelling of the abdomen that is new, acute  Fever of 100F or higher   For urgent or emergent issues, a gastroenterologist can be reached at any hour by calling [phone].   DIET:  We do recommend a small meal at first, but then you may proceed to your regular diet.  Drink plenty of fluids but you should avoid alcoholic beverages for 24 hours.  ACTIVITY:  You should plan to take it easy for the rest of today and you should NOT DRIVE or use heavy machinery until tomorrow (because of the sedation medicines used during the test).     FOLLOW UP: Our staff will call the number listed on your records the next business day following your procedure to check on you and address any questions or concerns that you may have regarding the information given to you following your procedure. If we do not reach you, we will leave a message.  However, if you are feeling well and you are not experiencing any problems, there is no need to return our call.  We will assume that you have returned to your regular daily activities without incident.  If any biopsies were taken you will be contacted by phone or by letter within the next 1-3 weeks.  Please call us at [phone] if you have not heard about the biopsies in 3 weeks.    SIGNATURES/CONFIDENTIALITY: You and/or your care partner have signed paperwork which will be entered into your electronic medical record.  These signatures attest to the fact that that the information above on your After Visit Summary has been reviewed and is understood.  Full responsibility of the confidentiality of this discharge information lies with you and/or your care-partner.  Thank you for letting us take your healthcare needs today.

## 2016-10-01 NOTE — Progress Notes (Signed)
Pt's states no medical or surgical changes since previsit or office visit. 

## 2016-10-02 ENCOUNTER — Telehealth: Payer: Self-pay | Admitting: *Deleted

## 2016-10-02 NOTE — Telephone Encounter (Signed)
  Follow up Call-  Call back number 10/01/2016  Post procedure Call Back phone  # [phone]  Permission to leave phone message No  Some recent data might be hidden     Patient questions:  Do you have a fever, pain , or abdominal swelling? Yes.   Pain Score  2 *  Have you tolerated food without any problems? Yes.    Have you been able to return to your normal activities? Yes.    Do you have any questions about your discharge instructions: Diet   No. Medications  No. Follow up visit  No.  Do you have questions or concerns about your Care? No.  Actions: * If pain score is 4 or above: Physician/ provider Notified : Charlie Pitter, MD   Pt states her temp is 100.8 this AM.  She has chronic back pain and is in some discomfort in her back this AM.  States she started running fever yesterday at 100.2.  She denies any abdominal pain or distention.  States she is able to eat and drink. I told her I would let you know.  She will Call us back if she has any abdominal pain, distention or fevers.

## 2016-10-02 NOTE — Telephone Encounter (Signed)
Let pt. Know that Dr. Myrtie Neither was advised of her temperature. He advised that fever was probably not related to colonoscopy.  Advised to call for abdominal distention or increased pain.  She verbalized understanding.

## 2016-10-02 NOTE — Telephone Encounter (Signed)
Sounds like something that will pass. Tylenol as needed to bring down temp. Let us know if further problems

## 2017-03-13 ENCOUNTER — Other Ambulatory Visit (INDEPENDENT_AMBULATORY_CARE_PROVIDER_SITE_OTHER): Payer: Self-pay | Admitting: Orthopaedic Surgery

## 2017-03-13 NOTE — Telephone Encounter (Signed)
Please advise 

## 2017-08-01 ENCOUNTER — Other Ambulatory Visit (INDEPENDENT_AMBULATORY_CARE_PROVIDER_SITE_OTHER): Payer: Self-pay | Admitting: Orthopaedic Surgery

## 2017-08-01 NOTE — Telephone Encounter (Signed)
Please advise 

## 2017-08-25 ENCOUNTER — Other Ambulatory Visit (INDEPENDENT_AMBULATORY_CARE_PROVIDER_SITE_OTHER): Payer: Self-pay | Admitting: Orthopaedic Surgery

## 2017-08-25 NOTE — Telephone Encounter (Signed)
Patient requested 90 day supply instead, ok?

## 2017-12-21 IMAGING — DX DG CHEST 1V
1 series · 1 of 1 positions shown · non-contrast
Comparison: None.

CLINICAL DATA: Screening study for pulmonary tuberculosis.

EXAM:
CHEST 1 VIEW

[chest ap]
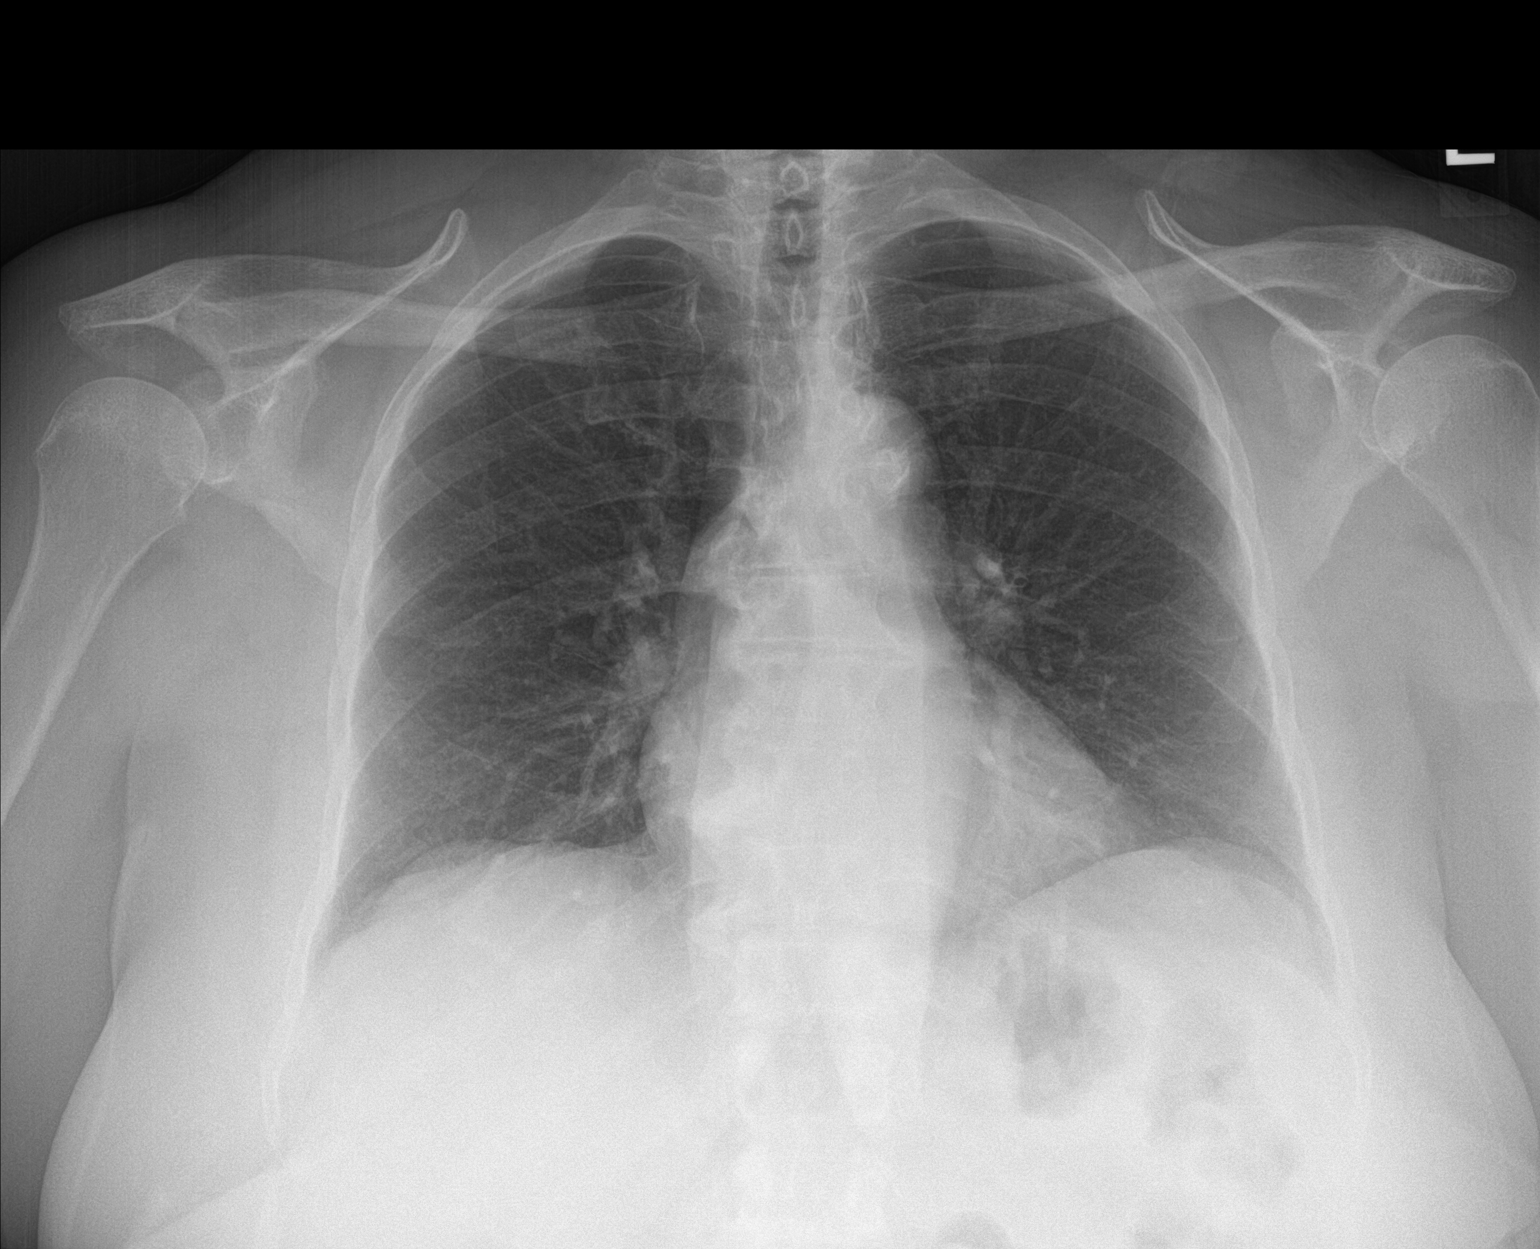

[1 of 1 positions shown; findings below may reference images not displayed]

FINDINGS: Heart size is normal. Mediastinal shadows are normal. Peripheral
lung parenchyma is clear. Projected over the aortic arch is of focal
calcification which could be vascular, could be nodal or could be in
the medial left upper lobe. Two-view chest radiography suggested
when able. This does not look like active tuberculosis but could be
evidence of old granulomatous infection.
IMPRESSION: Calcification projected over the aortic arch. Though this is
probably vascular, the possibility of a calcified lymph node or
calcified granuloma does exist. Two-view radiography recommended
when able.

## 2018-06-07 ENCOUNTER — Other Ambulatory Visit (INDEPENDENT_AMBULATORY_CARE_PROVIDER_SITE_OTHER): Payer: Self-pay | Admitting: Orthopaedic Surgery

## 2018-06-09 NOTE — Telephone Encounter (Signed)
Please advise 

## 2018-08-10 ENCOUNTER — Other Ambulatory Visit: Payer: Self-pay

## 2018-08-10 DIAGNOSIS — Z20822 Contact with and (suspected) exposure to covid-19: Secondary | ICD-10-CM

## 2018-08-12 LAB — NOVEL CORONAVIRUS, NAA: SARS-CoV-2, NAA: NOT DETECTED

## 2018-08-20 ENCOUNTER — Emergency Department (HOSPITAL_COMMUNITY)
Admission: EM | Admit: 2018-08-20 | Discharge: 2018-08-20 | Discharge status: Against Medical Advice | Payer: Medicare Other | Attending: Emergency Medicine | Admitting: Emergency Medicine

## 2018-08-20 ENCOUNTER — Other Ambulatory Visit: Payer: Self-pay

## 2018-08-20 ENCOUNTER — Emergency Department (HOSPITAL_COMMUNITY): Payer: Medicare Other

## 2018-08-20 DIAGNOSIS — Z5320 Procedure and treatment not carried out because of patient's decision for unspecified reasons: Secondary | ICD-10-CM | POA: Diagnosis not present

## 2018-08-20 DIAGNOSIS — R509 Fever, unspecified: Secondary | ICD-10-CM | POA: Diagnosis present

## 2018-08-20 DIAGNOSIS — R0602 Shortness of breath: Secondary | ICD-10-CM | POA: Insufficient documentation

## 2018-08-20 DIAGNOSIS — U071 COVID-19: Secondary | ICD-10-CM | POA: Insufficient documentation

## 2018-08-20 LAB — CBC WITH DIFFERENTIAL/PLATELET
Abs Immature Granulocytes: 0.01 10*3/uL (ref 0.00–0.07)
Basophils Absolute: 0 10*3/uL (ref 0.0–0.1)
Basophils Relative: 0 %
Eosinophils Absolute: 0.2 10*3/uL (ref 0.0–0.5)
Eosinophils Relative: 3 %
HCT: 32.9 % — ABNORMAL LOW (ref 36.0–46.0)
Hemoglobin: 10.8 g/dL — ABNORMAL LOW (ref 12.0–15.0)
Immature Granulocytes: 0 %
Lymphocytes Relative: 59 %
Lymphs Abs: 3.2 10*3/uL (ref 0.7–4.0)
MCH: 33.3 pg (ref 26.0–34.0)
MCHC: 32.8 g/dL (ref 30.0–36.0)
MCV: 101.5 fL — ABNORMAL HIGH (ref 80.0–100.0)
Monocytes Absolute: 0.5 10*3/uL (ref 0.1–1.0)
Monocytes Relative: 9 %
Neutro Abs: 1.6 10*3/uL — ABNORMAL LOW (ref 1.7–7.7)
Neutrophils Relative %: 29 %
Platelets: 268 10*3/uL (ref 150–400)
RBC: 3.24 MIL/uL — ABNORMAL LOW (ref 3.87–5.11)
RDW: 12.6 % (ref 11.5–15.5)
WBC: 5.5 10*3/uL (ref 4.0–10.5)
nRBC: 0 % (ref 0.0–0.2)

## 2018-08-20 LAB — BASIC METABOLIC PANEL
Anion gap: 10 (ref 5–15)
BUN: 23 mg/dL (ref 8–23)
CO2: 27 mmol/L (ref 22–32)
Calcium: 9.8 mg/dL (ref 8.9–10.3)
Chloride: 101 mmol/L (ref 98–111)
Creatinine, Ser: 1.14 mg/dL — ABNORMAL HIGH (ref 0.44–1.00)
GFR calc Af Amer: 58 mL/min — ABNORMAL LOW (ref >60)
GFR calc non Af Amer: 50 mL/min — ABNORMAL LOW (ref >60)
Glucose, Bld: 136 mg/dL — ABNORMAL HIGH (ref 70–99)
Potassium: 4 mmol/L (ref 3.5–5.1)
Sodium: 138 mmol/L (ref 135–145)

## 2018-08-20 LAB — SARS CORONAVIRUS 2 (TAT 6-24 HRS): SARS Coronavirus 2: POSITIVE — AB

## 2018-08-20 MED ORDER — SODIUM CHLORIDE 0.9 % IV BOLUS: 500.0000 mL | Freq: Once | INTRAVENOUS | Status: DC

## 2018-08-20 NOTE — ED Notes (Signed)
Pt ripped IV out of arm and ambulated to restroom. Collected urine sample. Pt stated "I had to use the bathroom before I peed all over myself". Lynnze(RN) present.

## 2018-08-20 NOTE — ED Triage Notes (Signed)
Pt reports recent covid positive in July.pt was retested recently and negative.pt reports having severe SOB and dizziness last night. Pt alert and oriented at triage.

## 2018-08-20 NOTE — ED Provider Notes (Signed)
[facility] EMERGENCY DEPARTMENT Provider Note   CSN: [id number] Arrival date & time: 08/20/18  [id number]    History   Chief Complaint Chief Complaint  Patient presents with  . Shortness of Breath    HPI Madeline Guerrero is a 68 y.o. female.     Patient is a 68 year old female who presents with fever and cough.  She reports that she was positive for COVID on July 10.  At that time she was feeling tired and lost her sense of taste and smell.  She also has had intermittent episodes of dizziness and some falls related to the dizziness over the last several weeks since she tested positive for COVID.  She states that she subsequently had a negative test on July 27.  Yesterday she started feeling bad again with runny nose congestion and coughing.  Her nose is stuffy.  She reports shortness of breath but it sounds like she was mostly having a hard time breathing through her nose that was congested.  Her cough is nonproductive.  She had a fever of 100.3.  No vomiting.  She has had some loose stools.  She is still having some lightheadedness on ambulation.     Past Medical History:  Diagnosis Date  . Arthritis   . Diabetes mellitus   . Hyperlipidemia   . Hypertension     Patient Active Problem List   Diagnosis Date Noted  . Clostridium difficile diarrhea 04/28/2016  . Uncontrolled type 2 diabetes mellitus with hyperglycemia, without long-term current use of insulin (HCC) 03/28/2015  . Chronic pain syndrome 07/06/2013  . Trochanteric bursitis of left hip 07/06/2013  . Chronic low back pain 07/06/2013  . Arthritis of left knee 04/15/2013  . Anxiety and depression 03/08/2011    Past Surgical History:  Procedure Laterality Date  . BREAST CYST EXCISION    . CARPAL TUNNEL RELEASE    . CHOLECYSTECTOMY    . KNEE ARTHROSCOPY Left   . NECK SURGERY    . SPINE SURGERY  2007   cervical     OB History   No obstetric history on file.      Home Medications     Prior to Admission medications   Medication Sig Start Date End Date Taking? Authorizing Provider  acetaminophen (TYLENOL) 325 MG tablet Take by mouth every 6 (six) hours as needed for fever.   Yes [provider]  ALPRAZolam Prudy Feeler(XANAX) 0.5 MG tablet Take 0.5 mg by mouth 3 (three) times daily as needed for anxiety.    Yes [provider]  Calcium Carbonate (CALCIUM 500 PO) Take 500 mg by mouth daily. Chewable   Yes [provider]  Cholecalciferol (VITAMIN D3 PO) Take 1 tablet by mouth daily.   Yes [provider]  [employer] ASPIRIN LOW DOSE 81 MG EC tablet Take 81 mg by mouth at bedtime.  06/08/18  Yes [provider]  DM-Doxylamine-Acetaminophen (NYQUIL COLD & FLU PO) Take 30 mLs by mouth as needed (cough).   Yes [provider]  Fexofenadine HCl (ALLEGRA ALLERGY PO) Take 1 tablet by mouth as needed (congestion).   Yes [provider]  gabapentin (NEURONTIN) 300 MG capsule TAKE 1 CAPSULE (300 MG TOTAL) BY MOUTH AT BEDTIME. 09/12/16  Yes Kathryne HitchBlackman, Christopher Y, MD  glipiZIDE (GLUCOTROL XL) 10 MG 24 hr tablet Take 1 tablet (10 mg total) by mouth daily with breakfast. Patient taking differently: Take 5 mg by mouth daily with breakfast.  10/27/15  Yes Trena PlattEnglish, Stephanie  D, PA  ipratropium (ATROVENT) 0.06 % nasal spray Place 2 sprays into both nostrils daily.   Yes [provider]  lisinopril-hydrochlorothiazide (ZESTORETIC) 20-25 MG tablet Take 1 tablet by mouth daily. 06/08/18  Yes [provider]  loratadine (CLARITIN) 10 MG tablet Take 10 mg by mouth as needed (congestion).   Yes [provider]  metFORMIN (GLUCOPHAGE) 1000 MG tablet Take 1 tablet (1,000 mg total) by mouth 2 (two) times daily with a meal. Patient taking differently: Take 500 mg by mouth daily with breakfast.  07/15/15  Yes Daub, Loura Back, MD  Multiple Vitamin (MULTIVITAMIN WITH MINERALS) TABS tablet Take 1 tablet by mouth daily.   Yes [provider]  Multiple Vitamins-Minerals (EMERGEN-C IMMUNE PLUS PO) Take 1 tablet by mouth daily.   Yes [provider]  pravastatin (PRAVACHOL) 20 MG tablet Take 1 tablet daily Patient taking differently: Take 20 mg by mouth every Monday, Wednesday, and Friday.  07/15/15  Yes Darlyne Russian, MD  zinc sulfate (ZINC-220) 220 (50 Zn) MG capsule Take 220 mg by mouth daily.   Yes [provider]  gabapentin (NEURONTIN) 300 MG capsule TAKE 1 CAPSULE BY MOUTH EVERYDAY AT BEDTIME Patient not taking: Reported on 08/20/2018 06/09/18   Mcarthur Rossetti, MD  pioglitazone (ACTOS) 30 MG tablet Take 30 mg by mouth daily.    04/05/11  [provider]    Family History Family History  Problem Relation Age of Onset  . Colon cancer Neg Hx   . Esophageal cancer Neg Hx   . Pancreatic cancer Neg Hx   . Rectal cancer Neg Hx   . Stomach cancer Neg Hx     Social History Social History   Tobacco Use  . Smoking status: Former Smoker    Packs/day: 0.50    Years: 10.00    Pack years: 5.00    Types: Cigarettes  . Smokeless tobacco: Never Used  . Tobacco comment: Quit one year ago  Substance Use Topics  . Alcohol use: No    Alcohol/week: 0.0 standard drinks  . Drug use: No     Allergies   Januvia [sitagliptin], Onglyza [saxagliptin], Tradjenta [linagliptin], Ultram [tramadol], Nsaids, Robaxin [methocarbamol], Tolmetin, Cefoxitin, and Levaquin [levofloxacin]   Review of Systems Review of Systems  Constitutional: Positive for fatigue and fever. Negative for chills and diaphoresis.  HENT: Positive for congestion and rhinorrhea. Negative for sneezing.   Eyes: Negative.   Respiratory: Positive for cough and shortness of breath. Negative for chest tightness.   Cardiovascular: Negative for chest pain and leg swelling.  Gastrointestinal: Positive for diarrhea. Negative for abdominal pain, blood in stool, nausea and vomiting.  Genitourinary: Negative for difficulty urinating,  flank pain, frequency and hematuria.  Musculoskeletal: Negative for arthralgias and back pain.  Skin: Negative for rash.  Neurological: Positive for light-headedness. Negative for dizziness, speech difficulty, weakness, numbness and headaches.     Physical Exam Updated Vital Signs BP (!) 125/56 (BP Location: Right Arm)   Pulse 64   Temp 97.9 F (36.6 C) (Oral)   Resp 18   Ht 5\' 2"  (1.575 m)   Wt 102.1 kg   SpO2 99%   BMI 41.17 kg/m   Physical Exam Constitutional:      Appearance: She is well-developed.  HENT:     Head: Normocephalic and atraumatic.     Mouth/Throat:     Mouth: Mucous membranes are moist.  Eyes:     Pupils: Pupils are equal, round, and reactive to  light.  Neck:     Musculoskeletal: Normal range of motion and neck supple.  Cardiovascular:     Rate and Rhythm: Normal rate and regular rhythm.     Heart sounds: Normal heart sounds.  Pulmonary:     Effort: Pulmonary effort is normal. No respiratory distress.     Breath sounds: Normal breath sounds. No wheezing or rales.  Chest:     Chest wall: No tenderness.  Abdominal:     General: Bowel sounds are normal.     Palpations: Abdomen is soft.     Tenderness: There is no abdominal tenderness. There is no guarding or rebound.  Musculoskeletal: Normal range of motion.     Comments: She has some bruising on her lower extremities from recent falls.  She has some tenderness in the anterior portion of her right knee from a recent fall.  There is no swelling or deformity noted.  No ligament instability.  No effusion noted.  No other bony tenderness on palpation or range of motion the extremities.  She has normal sensation and motor function distally.  Pedal pulses are intact.  Lymphadenopathy:     Cervical: No cervical adenopathy.  Skin:    General: Skin is warm and dry.     Findings: No rash.  Neurological:     Mental Status: She is alert and oriented to person, place, and time.      ED Treatments / Results   Labs (all labs ordered are listed, but only abnormal results are displayed) Labs Reviewed  BASIC METABOLIC PANEL - Abnormal; Notable for the following components:      Result Value   Glucose, Bld 136 (*)    Creatinine, Ser 1.14 (*)    GFR calc non Af Amer 50 (*)    GFR calc Af Amer 58 (*)    All other components within normal limits  CBC WITH DIFFERENTIAL/PLATELET - Abnormal; Notable for the following components:   RBC 3.24 (*)    Hemoglobin 10.8 (*)    HCT 32.9 (*)    MCV 101.5 (*)    Neutro Abs 1.6 (*)    All other components within normal limits  SARS CORONAVIRUS 2  URINALYSIS, ROUTINE W REFLEX MICROSCOPIC    EKG None  Radiology Dg Chest Port 1 View  Result Date: 08/20/2018 CLINICAL DATA:  Shortness of breath. Cough. EXAM: PORTABLE CHEST 1 VIEW COMPARISON:  10/27/2015 FINDINGS: The heart size and mediastinal contours are within normal limits. Both lungs are clear. The visualized skeletal structures are unremarkable. Aortic atherosclerosis. IMPRESSION: 1. No acute cardiopulmonary disease. 2. Aortic atherosclerosis. Electronically Signed   By: Francene BoyersJames  Maxwell M.D.   On: 08/20/2018 13:08   Dg Knee Complete 4 Views Right  Result Date: 08/20/2018 CLINICAL DATA:  Knee pain secondary to a fall. EXAM: RIGHT KNEE - COMPLETE 4+ VIEW COMPARISON:  11/11/2015 FINDINGS: No evidence of fracture, dislocation, or joint effusion. There are calcifications in the distal quadriceps tendon and in the proximal patellar tendon with enthesophytes on the upper pole of the patella. Small marginal osteophytes on the patella and in the medial compartment. Soft tissues are unremarkable. IMPRESSION: No acute abnormality. Arthritic changes as described. Electronically Signed   By: Francene BoyersJames  Maxwell M.D.   On: 08/20/2018 13:10    Procedures Procedures (including critical care time)  Medications Ordered in ED Medications  sodium chloride 0.9 % bolus 500 mL (has no administration in time range)     Initial  Impression / Assessment and Plan /  ED Course  I have reviewed the triage vital signs and the nursing notes.  Pertinent labs & imaging results that were available during my care of the patient were reviewed by me and considered in my medical decision making (see chart for details).        Patient 68 year old female who presents with fever and cough that started yesterday.  This was after a recent COVID infection.  Her chest x-ray is clear without evidence of pneumonia.  Her Covid test today is negative.  She her labs showed a mildly elevated creatinine.  She has some anemia but this is similar to prior values.  She was waiting to get IV fluids and we have been waiting for a urine sample.  However she eloped without discussion and without my knowledge.  Further work-up could not be completed.  Final Clinical Impressions(s) / ED Diagnoses   Final diagnoses:  Febrile illness    ED Discharge Orders    None       Rolan BuccoBelfi, Maki Sweetser, MD 08/20/18 1737

## 2018-08-20 NOTE — ED Notes (Signed)
Pt seen dressed in her clothing. Pt had all belongings and was walking towards the door. When asked where she was going pt stated "home" pt would not stop and talk to staff. Dr. Tamera Punt made aware. Pt in no apparent acute distress.  Lattie Haw RN, pts nurse present for this.

## 2019-04-08 ENCOUNTER — Other Ambulatory Visit (INDEPENDENT_AMBULATORY_CARE_PROVIDER_SITE_OTHER): Payer: Self-pay | Admitting: Orthopaedic Surgery

## 2019-04-08 NOTE — Telephone Encounter (Signed)
Please advise 

## 2019-05-20 ENCOUNTER — Ambulatory Visit: Payer: Self-pay

## 2019-05-20 ENCOUNTER — Other Ambulatory Visit: Payer: Self-pay

## 2019-05-20 ENCOUNTER — Ambulatory Visit (INDEPENDENT_AMBULATORY_CARE_PROVIDER_SITE_OTHER): Payer: Medicare Other | Admitting: Surgical

## 2019-05-20 DIAGNOSIS — M7502 Adhesive capsulitis of left shoulder: Secondary | ICD-10-CM

## 2019-05-20 DIAGNOSIS — M79602 Pain in left arm: Secondary | ICD-10-CM | POA: Diagnosis not present

## 2019-05-22 ENCOUNTER — Encounter: Payer: Self-pay | Admitting: Surgical

## 2019-05-22 NOTE — Progress Notes (Signed)
Office Visit Note   Patient: Madeline Guerrero           Date of Birth: 1950-05-08           MRN: [id number] Visit Date: 05/20/2019 Requested by: Fleet Contras, MD [address] [place],  [place] [address] PCP: Fleet Contras, MD  Subjective: Chief Complaint  Patient presents with  . Left Shoulder - Pain  . Neck - Pain    HPI: Madeline Guerrero is a 69 y.o. female who presents to the office complaining of primarily left shoulder pain.  She notes over the last 2 months a gradual onset of worsening left shoulder pain.  She has associated decreased range of motion of the left shoulder.  She has pain with most movements of the shoulder.  Localizes most of her pain to the lateral aspect of the left shoulder as well as into the shoulder blade.  She denies weakness of the shoulder.  She has been taking Tylenol and using Voltaren gel without significant relief.  She cannot take NSAIDs.  She gets occasional numbness or tingling but it does not seem to be a big concern for this.  She has a history of diabetes but denies any history of thyroid issues.  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  She has previous cervical spine surgery about 7 years ago by Dr. Otelia Sergeant.  She denies any radicular pain stemming from her neck down her arm..                ROS:  All systems reviewed are negative as they relate to the chief complaint within the history of present illness.  Patient denies fevers or chills.  Assessment & Plan: Visit Diagnoses:  1. Adhesive capsulitis of left shoulder   2. Left arm pain     Plan: Patient is a 69 year old female presents complaining of primarily left shoulder pain.  She has no known injury.  She notes gradual onset of worsening pain over the last 2 months with associated decreased range of motion of the shoulder.  On exam she does have decreased external rotation, internal rotation, abduction compared with the contralateral side.  She has  excellent strength of the rotator cuff.  Impression is left shoulder adhesive capsulitis.  Left shoulder glenohumeral injection was administered and patient tolerated procedure well.  She was also given shoulder exercises to do daily to help with improving her range of motion.  Plan for patient return in 6 weeks for clinical recheck.  Patient agrees with this plan.  Follow-Up Instructions: No follow-ups on file.   Orders:  Orders Placed This Encounter  Procedures  . XR Shoulder Left  . XR Cervical Spine 2 or 3 views   No orders of the defined types were placed in this encounter.     Procedures: No procedures performed   Clinical Data: No additional findings.  Objective: Vital Signs: There were no vitals taken for this visit.  Physical Exam:  Constitutional: Patient appears well-developed HEENT:  Head: Normocephalic Eyes:EOM are normal Neck: Normal range of motion Cardiovascular: Normal rate Pulmonary/chest: Effort normal Neurologic: Patient is alert Skin: Skin is warm Psychiatric: Patient has normal mood and affect  Ortho Exam:  Left shoulder Exam Mild decreased external rotation, internal rotation, abduction of the left shoulder compared to the contralateral side.  Forward flexion appears to be equivalent No TTP over the [facility] joint Mild tenderness to palpation over the bicipital groove Good subscapularis, supraspinatus, and infraspinatus strength  Negative Hawkins impingement 5/5 grip strength, forearm pronation/supination, and bicep strength  Specialty Comments:  No specialty comments available.  Imaging: No results found.   PMFS History: Patient Active Problem List   Diagnosis Date Noted  . Clostridium difficile diarrhea 04/28/2016  . Uncontrolled type 2 diabetes mellitus with hyperglycemia, without long-term current use of insulin ([facility]) 03/28/2015  . Chronic pain syndrome 07/06/2013  . Trochanteric bursitis of left hip 07/06/2013  . Chronic low back pain  07/06/2013  . Arthritis of left knee 04/15/2013  . Anxiety and depression 03/08/2011   Past Medical History:  Diagnosis Date  . Arthritis   . Diabetes mellitus   . Hyperlipidemia   . Hypertension     Family History  Problem Relation Age of Onset  . Colon cancer Neg Hx   . Esophageal cancer Neg Hx   . Pancreatic cancer Neg Hx   . Rectal cancer Neg Hx   . Stomach cancer Neg Hx     Past Surgical History:  Procedure Laterality Date  . BREAST CYST EXCISION    . CARPAL TUNNEL RELEASE    . CHOLECYSTECTOMY    . KNEE ARTHROSCOPY Left   . NECK SURGERY    . SPINE SURGERY  2007   cervical   Social History   Occupational History  . Not on file  Tobacco Use  . Smoking status: Former Smoker    Packs/day: 0.50    Years: 10.00    Pack years: 5.00    Types: Cigarettes  . Smokeless tobacco: Never Used  . Tobacco comment: Quit one year ago  Substance and Sexual Activity  . Alcohol use: No    Alcohol/week: 0.0 standard drinks  . Drug use: No  . Sexual activity: Not on file

## 2019-07-01 ENCOUNTER — Ambulatory Visit: Payer: Medicare Other | Admitting: Orthopedic Surgery

## 2019-07-29 ENCOUNTER — Other Ambulatory Visit: Payer: Self-pay | Admitting: Internal Medicine

## 2019-07-29 DIAGNOSIS — E2839 Other primary ovarian failure: Secondary | ICD-10-CM

## 2019-07-29 DIAGNOSIS — Z1231 Encounter for screening mammogram for malignant neoplasm of breast: Secondary | ICD-10-CM

## 2019-10-22 ENCOUNTER — Ambulatory Visit: Payer: Medicare Other

## 2019-10-22 ENCOUNTER — Other Ambulatory Visit: Payer: Medicare Other

## 2020-02-04 ENCOUNTER — Other Ambulatory Visit (INDEPENDENT_AMBULATORY_CARE_PROVIDER_SITE_OTHER): Payer: Self-pay | Admitting: Orthopaedic Surgery

## 2020-11-08 ENCOUNTER — Ambulatory Visit: Payer: Medicare Other | Admitting: Orthopaedic Surgery

## 2020-11-22 ENCOUNTER — Emergency Department (HOSPITAL_COMMUNITY)
Admission: EM | Admit: 2020-11-22 | Discharge: 2020-11-23 | Disposition: A | Discharge status: Home / Self Care | Payer: Medicare Other | Attending: Emergency Medicine | Admitting: Emergency Medicine

## 2020-11-22 ENCOUNTER — Encounter (HOSPITAL_COMMUNITY): Payer: Self-pay | Admitting: Emergency Medicine

## 2020-11-22 ENCOUNTER — Emergency Department (HOSPITAL_COMMUNITY): Payer: Medicare Other

## 2020-11-22 ENCOUNTER — Other Ambulatory Visit: Payer: Self-pay

## 2020-11-22 DIAGNOSIS — I10 Essential (primary) hypertension: Secondary | ICD-10-CM | POA: Diagnosis not present

## 2020-11-22 DIAGNOSIS — Z79899 Other long term (current) drug therapy: Secondary | ICD-10-CM | POA: Diagnosis not present

## 2020-11-22 DIAGNOSIS — Z7984 Long term (current) use of oral hypoglycemic drugs: Secondary | ICD-10-CM | POA: Insufficient documentation

## 2020-11-22 DIAGNOSIS — Z96652 Presence of left artificial knee joint: Secondary | ICD-10-CM | POA: Insufficient documentation

## 2020-11-22 DIAGNOSIS — M79602 Pain in left arm: Secondary | ICD-10-CM | POA: Diagnosis present

## 2020-11-22 DIAGNOSIS — M5412 Radiculopathy, cervical region: Secondary | ICD-10-CM | POA: Diagnosis not present

## 2020-11-22 DIAGNOSIS — E119 Type 2 diabetes mellitus without complications: Secondary | ICD-10-CM | POA: Insufficient documentation

## 2020-11-22 DIAGNOSIS — Z87891 Personal history of nicotine dependence: Secondary | ICD-10-CM | POA: Insufficient documentation
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
## 2020-11-22 NOTE — ED Triage Notes (Signed)
Pt here for L arm numbness x4 days w/ pain in her neck, she has been having shooting pains from L neck down to arm w/ numbness and intermittent pins and needles. Pt also had a fall last week because of L leg weakness, also c/o lower back pain

## 2020-11-23 DIAGNOSIS — M5412 Radiculopathy, cervical region: Secondary | ICD-10-CM | POA: Diagnosis not present

## 2020-11-23 MED ORDER — PREDNISONE 20 MG PO TABS
60.0000 mg | ORAL_TABLET | Freq: Once | ORAL | Status: AC
  Administered 2020-11-23: 60 mg via ORAL
  Filled 2020-11-23: qty 3

## 2020-11-23 MED ORDER — OXYCODONE-ACETAMINOPHEN 5-325 MG PO TABS
1.0000 | ORAL_TABLET | Freq: Once | ORAL | Status: AC
  Administered 2020-11-23: 1 via ORAL
  Filled 2020-11-23: qty 1

## 2020-11-23 MED ORDER — PREDNISONE 10 MG PO TABS: ORAL_TABLET | ORAL | 0 refills | Status: AC

## 2020-11-23 NOTE — ED Provider Notes (Signed)
[facility] EMERGENCY DEPARTMENT Provider Note   CSN: [id number] Arrival date & time: 11/22/20  1639     History Chief Complaint  Patient presents with   Fall   pinched nerve    Madeline Guerrero is a 70 y.o. female.  Patient presents to the emergency department with complaints of left arm pain with numbness and tingling.  Patient reports a history of prior cervical spine surgery and a pinched nerve.  Patient reports that she fell earlier this week.  The fall occurred because she has a meniscus tear in her left knee and gives out sometimes.  Patient complaining of increased pain, burning and tingling in the arm since the fall.  She has not noticed any weakness.      Past Medical History:  Diagnosis Date   Arthritis    Diabetes mellitus    Hyperlipidemia    Hypertension     Patient Active Problem List   Diagnosis Date Noted   Clostridium difficile diarrhea 04/28/2016   Uncontrolled type 2 diabetes mellitus with hyperglycemia, without long-term current use of insulin (HCC) 03/28/2015   Chronic pain syndrome 07/06/2013   Trochanteric bursitis of left hip 07/06/2013   Chronic low back pain 07/06/2013   Arthritis of left knee 04/15/2013   Anxiety and depression 03/08/2011    Past Surgical History:  Procedure Laterality Date   BREAST CYST EXCISION     CARPAL TUNNEL RELEASE     CHOLECYSTECTOMY     KNEE ARTHROSCOPY Left    NECK SURGERY     SPINE SURGERY  2007   cervical     OB History   No obstetric history on file.     Family History  Problem Relation Age of Onset   Colon cancer Neg Hx    Esophageal cancer Neg Hx    Pancreatic cancer Neg Hx    Rectal cancer Neg Hx    Stomach cancer Neg Hx     Social History   Tobacco Use   Smoking status: Former    Packs/day: 0.50    Years: 10.00    Pack years: 5.00    Types: Cigarettes   Smokeless tobacco: Never   Tobacco comments:    Quit one year ago  Substance Use Topics   Alcohol  use: No    Alcohol/week: 0.0 standard drinks   Drug use: No    Home Medications Prior to Admission medications   Medication Sig Start Date End Date Taking? Authorizing Provider  predniSONE (DELTASONE) 10 MG tablet Take 5 tablets (50 mg total) by mouth daily for 2 days, THEN 4 tablets (40 mg total) daily for 2 days, THEN 3 tablets (30 mg total) daily for 2 days, THEN 2 tablets (20 mg total) daily for 2 days, THEN 1 tablet (10 mg total) daily for 2 days. 11/23/20 12/03/20 Yes Quintavius Niebuhr, Canary Brim, MD  acetaminophen (TYLENOL) 325 MG tablet Take by mouth every 6 (six) hours as needed for fever.    [provider]  ALPRAZolam Prudy Feeler) 0.5 MG tablet Take 0.5 mg by mouth 3 (three) times daily as needed for anxiety.     [provider]  Calcium Carbonate (CALCIUM 500 PO) Take 500 mg by mouth daily. Chewable    [provider]  Cholecalciferol (VITAMIN D3 PO) Take 1 tablet by mouth daily.    [provider]  [employer] ASPIRIN LOW DOSE 81 MG EC tablet Take 81 mg by mouth at bedtime.  06/08/18   [provider]  DM-Doxylamine-Acetaminophen (NYQUIL COLD & FLU PO) Take 30 mLs by mouth as needed (cough).    [provider]  Fexofenadine HCl (ALLEGRA ALLERGY PO) Take 1 tablet by mouth as needed (congestion).    [provider]  gabapentin (NEURONTIN) 300 MG capsule TAKE 1 CAPSULE (300 MG TOTAL) BY MOUTH AT BEDTIME. 09/12/16   Kathryne Hitch, MD  gabapentin (NEURONTIN) 300 MG capsule TAKE 1 CAPSULE BY MOUTH EVERYDAY AT BEDTIME 02/04/20   Kathryne Hitch, MD  glipiZIDE (GLUCOTROL XL) 10 MG 24 hr tablet Take 1 tablet (10 mg total) by mouth daily with breakfast. Patient taking differently: Take 5 mg by mouth daily with breakfast.  10/27/15   Trena Platt D, PA  ipratropium (ATROVENT) 0.06 % nasal spray Place 2 sprays into both nostrils daily.    [provider]  lisinopril-hydrochlorothiazide (ZESTORETIC) 20-25 MG tablet Take 1  tablet by mouth daily. 06/08/18   [provider]  loratadine (CLARITIN) 10 MG tablet Take 10 mg by mouth as needed (congestion).    [provider]  metFORMIN (GLUCOPHAGE) 1000 MG tablet Take 1 tablet (1,000 mg total) by mouth 2 (two) times daily with a meal. Patient taking differently: Take 500 mg by mouth daily with breakfast.  07/15/15   Collene Gobble, MD  Multiple Vitamin (MULTIVITAMIN WITH MINERALS) TABS tablet Take 1 tablet by mouth daily.    [provider]  Multiple Vitamins-Minerals (EMERGEN-C IMMUNE PLUS PO) Take 1 tablet by mouth daily.    [provider]  pravastatin (PRAVACHOL) 20 MG tablet Take 1 tablet daily Patient taking differently: Take 20 mg by mouth every Monday, Wednesday, and Friday.  07/15/15   Collene Gobble, MD  zinc sulfate (ZINC-220) 220 (50 Zn) MG capsule Take 220 mg by mouth daily.    [provider]  pioglitazone (ACTOS) 30 MG tablet Take 30 mg by mouth daily.    04/05/11  [provider]    Allergies    Januvia [sitagliptin], Onglyza [saxagliptin], Tradjenta [linagliptin], Ultram [tramadol], Nsaids, Robaxin [methocarbamol], Tolmetin, Cefoxitin, and Levaquin [levofloxacin]  Review of Systems   Review of Systems  Musculoskeletal:  Positive for arthralgias and neck pain.  Neurological:  Positive for numbness.  All other systems reviewed and are negative.  Physical Exam Updated Vital Signs BP (!) 177/68 (BP Location: Left Arm)   Pulse 65   Temp (!) 97.3 F (36.3 C) (Temporal)   Resp 18   SpO2 100%   Physical Exam Vitals and nursing note reviewed.  Constitutional:      General: She is not in acute distress.    Appearance: Normal appearance. She is well-developed.  HENT:     Head: Normocephalic and atraumatic.     Right Ear: Hearing normal.     Left Ear: Hearing normal.     Nose: Nose normal.  Eyes:     Conjunctiva/sclera: Conjunctivae normal.     Pupils: Pupils are equal, round, and reactive to  light.  Neck:   Cardiovascular:     Rate and Rhythm: Regular rhythm.     Heart sounds: S1 normal and S2 normal. No murmur heard.   No friction rub. No gallop.  Pulmonary:     Effort: Pulmonary effort is normal. No respiratory distress.     Breath sounds: Normal breath sounds.  Chest:     Chest wall: No tenderness.  Abdominal:     General: Bowel sounds are normal.     Palpations: Abdomen is soft.  Tenderness: There is no abdominal tenderness. There is no guarding or rebound. Negative signs include Murphy's sign and McBurney's sign.     Hernia: No hernia is present.  Musculoskeletal:        General: Normal range of motion.     Cervical back: Normal range of motion and neck supple. Pain with movement and muscular tenderness present.  Skin:    General: Skin is warm and dry.     Findings: No rash.  Neurological:     Mental Status: She is alert and oriented to person, place, and time.     GCS: GCS eye subscore is 4. GCS verbal subscore is 5. GCS motor subscore is 6.     Cranial Nerves: No cranial nerve deficit.     Sensory: No sensory deficit.     Coordination: Coordination normal.  Psychiatric:        Speech: Speech normal.        Behavior: Behavior normal.        Thought Content: Thought content normal.    ED Results / Procedures / Treatments   Labs (all labs ordered are listed, but only abnormal results are displayed) Labs Reviewed - No data to display  EKG None  Radiology MR Cervical Spine Wo Contrast  Result Date: 11/23/2020 CLINICAL DATA:  Initial evaluation for spinal stenosis. Neck pain with radiation into the left upper extremity. EXAM: MRI CERVICAL SPINE WITHOUT CONTRAST TECHNIQUE: Multiplanar, multisequence MR imaging of the cervical spine was performed. No intravenous contrast was administered. COMPARISON:  Radiograph from 05/20/2019. FINDINGS: Alignment: Straightening of the normal cervical lordosis. Trace anterolisthesis of C2 on C3, likely chronic and facet  mediated. Vertebrae: Prior ACDF at C4-5. There appears to be solid arthrodesis at this level. Minimal chronic height loss noted at the superior endplates of T2, T3, and T5, which could be degenerative or possibly related to prior trauma. Vertebral body height otherwise maintained with no acute or subacute fracture. Bone marrow signal intensity within normal limits. No discrete or worrisome osseous lesions. No abnormal marrow edema. Cord: Normal signal morphology. Posterior Fossa, vertebral arteries, paraspinal tissues: Visualized brain and posterior fossa within normal limits. Craniocervical junction normal. Paraspinous and prevertebral soft tissues within normal limits. Normal intravascular flow voids seen within the vertebral arteries bilaterally. Disc levels: C2-C3: Trace anterolisthesis with mild disc bulge. Moderate left with mild right facet hypertrophy. No spinal stenosis. Mild to moderate left C3 foraminal narrowing. Right neural foramina remains patent. C3-C4: Disc bulge with bilateral uncovertebral hypertrophy. Mild flattening of the ventral thecal sac without significant spinal stenosis. Mild left with moderate right C4 foraminal narrowing. C4-C5: Prior fusion. No residual spinal stenosis. Residual uncovertebral disease with residual mild left C5 foraminal narrowing. No significant right foraminal stenosis. C5-C6: Disc bulge with bilateral uncovertebral hypertrophy. Mild flattening of the ventral thecal sac. Superimposed mild facet and ligament flavum hypertrophy. No significant spinal stenosis. Moderate left worse than right C6 foraminal narrowing. C6-C7: Degenerative intervertebral disc space narrowing with diffuse disc bulge. Uncovertebral and endplate spurring. Broad posterior disc osteophyte mildly flattens the ventral thecal sac. Superimposed mild ligament flavum hypertrophy. No significant spinal stenosis. Moderate bilateral C7 foraminal narrowing, slightly worse on the left. C7-T1: Minimal disc  bulge. Left-sided facet and ligament flavum hypertrophy. No spinal stenosis. Mild left C8 foraminal narrowing. Right neural foramen remains patent. Visualized upper thoracic spine demonstrates mild multilevel noncompressive disc bulging without significant stenosis. IMPRESSION: 1. Prior ACDF at C4-5 without residual spinal stenosis. Residual uncovertebral disease at this level with  resultant mild left C5 foraminal narrowing. 2. 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 3. Moderate left facet hypertrophy at C2-3 with resultant mild to moderate left C3 foraminal stenosis. 4. Mild chronic height loss at the superior endplates of T2, T3, and T5, which could be degenerative or possibly related to remote trauma. Electronically Signed   By: Rise Mu M.D.   On: 11/23/2020 00:08    Procedures Procedures   Medications Ordered in ED Medications  oxyCODONE-acetaminophen (PERCOCET/ROXICET) 5-325 MG per tablet 1 tablet (has no administration in time range)  predniSONE (DELTASONE) tablet 60 mg (has no administration in time range)    ED Course  I have reviewed the triage vital signs and the nursing notes.  Pertinent labs & imaging results that were available during my care of the patient were reviewed by me and considered in my medical decision making (see chart for details).    MDM Rules/Calculators/A&P                           Patient presents to the emergency department for evaluation of increased pain in the neck and left arm with numbness and tingling.  Examination reveals normal strength although she does have very slight painful inhibition with movement of the arm.  There is no deformity of the shoulder or upper arm.  She has normal sensation across all 3 nerves in the hand.  Grip strength is normal.  Flexion extension at elbow is normal.  Patient underwent MRI which does not show any acute injury.  She cannot take  NSAIDs.  She reports that she has been on prednisone in the past and can manage her blood sugars while on it.  She is a Engineer, civil (consulting).  We will therefore initiate steroid treatment to help with her cervical radiculopathy as well as her chronic osteoarthritis of the left knee.  Final Clinical Impression(s) / ED Diagnoses Final diagnoses:  Cervical radiculopathy    Rx / DC Orders ED Discharge Orders          Ordered    predniSONE (DELTASONE) 10 MG tablet        11/23/20 0222             Gilda Crease, MD 11/23/20 0222

## 2020-11-23 NOTE — Discharge Instructions (Signed)
Watch your blood sugars closely while you are taking the prednisone.

## 2020-12-13 ENCOUNTER — Ambulatory Visit: Payer: Self-pay

## 2020-12-13 ENCOUNTER — Ambulatory Visit (INDEPENDENT_AMBULATORY_CARE_PROVIDER_SITE_OTHER): Payer: Medicare Other | Admitting: Orthopedic Surgery

## 2020-12-13 ENCOUNTER — Other Ambulatory Visit: Payer: Self-pay | Admitting: Internal Medicine

## 2020-12-13 ENCOUNTER — Encounter: Payer: Self-pay | Admitting: Orthopedic Surgery

## 2020-12-13 ENCOUNTER — Other Ambulatory Visit: Payer: Self-pay

## 2020-12-13 ENCOUNTER — Ambulatory Visit (INDEPENDENT_AMBULATORY_CARE_PROVIDER_SITE_OTHER): Payer: Medicare Other

## 2020-12-13 DIAGNOSIS — Z9889 Other specified postprocedural states: Secondary | ICD-10-CM | POA: Diagnosis not present

## 2020-12-13 DIAGNOSIS — M79602 Pain in left arm: Secondary | ICD-10-CM | POA: Diagnosis not present

## 2020-12-13 DIAGNOSIS — Z1231 Encounter for screening mammogram for malignant neoplasm of breast: Secondary | ICD-10-CM

## 2020-12-13 MED ORDER — HYDROCODONE-ACETAMINOPHEN 5-325 MG PO TABS: ORAL_TABLET | ORAL | 0 refills | Status: DC

## 2020-12-15 ENCOUNTER — Encounter: Payer: Self-pay | Admitting: Orthopedic Surgery

## 2020-12-15 NOTE — Progress Notes (Signed)
Office Visit Note   Patient: Madeline Guerrero           Date of Birth: 12/20/1950           MRN: [id number] Visit Date: 12/13/2020 Requested by: Fleet Contras, MD [address] [place],  [place] [address] PCP: Fleet Contras, MD  Subjective: Chief Complaint  Patient presents with   Neck - Follow-up   Right Knee - Follow-up    HPI: Patient presents for evaluation of neck pain.  Has neck pain radiating down the left arm.  Describes burning and numbness.  The neck pain has been present since the last office visit.  Unable to take anti-inflammatories.  She has had neck surgery done by Dr. Otelia Sergeant.  She is not taking any blood thinners.  She does take muscle relaxer.  Neurontin does not help much.  She also has  knee pain and arthritis.              ROS: All systems reviewed are negative as they relate to the chief complaint within the history of present illness.  Patient denies  fevers or chills.   Assessment & Plan: Visit Diagnoses:  1. Post-operative state   2. Left arm pain     Plan: Impression is left knee pain and radiculopathy affecting the left arm.  Plan is one-time prescription for Norco with referral to Dr. Alvester Morin for C-spine ESI.  Aspiration injection left knee performed.  Follow-up as needed.  She does have some arthritis in that left knee in that medial compartment.  Follow-Up Instructions: No follow-ups on file.   Orders:  Orders Placed This Encounter  Procedures   XR KNEE 3 VIEW LEFT   Ambulatory referral to Physical Medicine Rehab   Meds ordered this encounter  Medications   HYDROcodone-acetaminophen (NORCO/VICODIN) 5-325 MG tablet    Sig: 1 po q hs prn pain    Dispense:  25 tablet    Refill:  0      Procedures: No procedures performed   Clinical Data: No additional findings.  Objective: Vital Signs: There were no vitals taken for this visit.  Physical Exam:   Constitutional: Patient appears well-developed HEENT:  Head:  Normocephalic Eyes:EOM are normal Neck: Normal range of motion Cardiovascular: Normal rate Pulmonary/chest: Effort normal Neurologic: Patient is alert Skin: Skin is warm Psychiatric: Patient has normal mood and affect   Ortho Exam: Ortho exam demonstrates mild effusion left knee.  Collateral cruciate ligaments are stable.  No groin pain with internal ex rotation leg.  Pedal pulses palpable.  No significant patellofemoral crepitus is present.  Patient is 5 out of 5 grip EPL FPL interosseous are/extension bicep tricep and deltoid strength is good cervical spine range of motion.  Mild paresthesias C6 on the left compared to the right-hand side.  No muscle atrophy in that left arm.  Specialty Comments:  No specialty comments available.  Imaging: No results found.   PMFS History: Patient Active Problem List   Diagnosis Date Noted   Clostridium difficile diarrhea 04/28/2016   Uncontrolled type 2 diabetes mellitus with hyperglycemia, without long-term current use of insulin (HCC) 03/28/2015   Chronic pain syndrome 07/06/2013   Trochanteric bursitis of left hip 07/06/2013   Chronic low back pain 07/06/2013   Arthritis of left knee 04/15/2013   Anxiety and depression 03/08/2011   Past Medical History:  Diagnosis Date   Arthritis    Diabetes mellitus    Hyperlipidemia    Hypertension  Family History  Problem Relation Age of Onset   Colon cancer Neg Hx    Esophageal cancer Neg Hx    Pancreatic cancer Neg Hx    Rectal cancer Neg Hx    Stomach cancer Neg Hx     Past Surgical History:  Procedure Laterality Date   BREAST CYST EXCISION     CARPAL TUNNEL RELEASE     CHOLECYSTECTOMY     KNEE ARTHROSCOPY Left    NECK SURGERY     SPINE SURGERY  2007   cervical   Social History   Occupational History   Not on file  Tobacco Use   Smoking status: Former    Packs/day: 0.50    Years: 10.00    Pack years: 5.00    Types: Cigarettes   Smokeless tobacco: Never   Tobacco  comments:    Quit one year ago  Substance and Sexual Activity   Alcohol use: No    Alcohol/week: 0.0 standard drinks   Drug use: No   Sexual activity: Not on file

## 2020-12-26 ENCOUNTER — Other Ambulatory Visit: Payer: Self-pay

## 2020-12-26 ENCOUNTER — Encounter: Payer: Self-pay | Admitting: Physical Medicine and Rehabilitation

## 2020-12-26 ENCOUNTER — Ambulatory Visit (INDEPENDENT_AMBULATORY_CARE_PROVIDER_SITE_OTHER): Payer: Medicare Other | Admitting: Physical Medicine and Rehabilitation

## 2020-12-26 VITALS — BP 155/80 | HR 81

## 2020-12-26 DIAGNOSIS — M542 Cervicalgia: Secondary | ICD-10-CM

## 2020-12-26 DIAGNOSIS — M47812 Spondylosis without myelopathy or radiculopathy, cervical region: Secondary | ICD-10-CM

## 2020-12-26 DIAGNOSIS — M4712 Other spondylosis with myelopathy, cervical region: Secondary | ICD-10-CM

## 2020-12-26 DIAGNOSIS — M5412 Radiculopathy, cervical region: Secondary | ICD-10-CM | POA: Diagnosis not present

## 2020-12-26 MED ORDER — DIAZEPAM 5 MG PO TABS: ORAL_TABLET | ORAL | 0 refills | Status: AC

## 2020-12-26 MED ORDER — HYDROCODONE-ACETAMINOPHEN 5-325 MG PO TABS: 1.0000 | ORAL_TABLET | Freq: Three times a day (TID) | ORAL | 0 refills | Status: AC | PRN

## 2020-12-26 NOTE — Progress Notes (Signed)
Pt state neck pain that travels down her left arm. Pt state any movement makes the pain worse. Pt state she take [place] meds to help ease her pain.   Numeric Pain Rating Scale and Functional Assessment Average Pain 10 Pain Right Now 8 My pain is intermittent, burning, stabbing, and tingling Pain is worse with: some activites Pain improves with: medication   In the last MONTH (on 0-10 scale) has pain interfered with the following?  1. General activity like being  able to carry out your everyday physical activities such as walking, climbing stairs, carrying groceries, or moving a chair?  Rating(7)  2. Relation with others like being able to carry out your usual social activities and roles such as  activities at home, at work and in your community. Rating(8)  3. Enjoyment of life such that you have  been bothered by emotional problems such as feeling anxious, depressed or irritable?  Rating(9)

## 2020-12-26 NOTE — Progress Notes (Signed)
Madeline Guerrero - 70 y.o. female MRN [id number]  Date of birth: Sep 29, 1950  Office Visit Note: Visit Date: 12/26/2020 PCP: Fleet Contras, MD Referred by: Fleet Contras, MD  Subjective: Chief Complaint  Patient presents with   Neck - Pain   Right Arm - Pain   HPI: Madeline Guerrero is a 70 y.o. female who comes in today per the request of Dr. Dorene Grebe for evaluation of chronic, worsening and severe left sided neck pain radiating to left arm. Patient reports pain ongoing for several months. She reports pain is exacerbated by movement and activity, describes pain as constant soreness, rates as 9 out of 10. Patient reports some relief of pain with home exercise regimen and use of Vicodin. Patient did have prior ACDF of C4-C5 performed by Dr. Vira Browns in 2007. Patient's recent cervical MRI exhibits prior ACDF of C4-C5 with no residual stenosis, multi-level facet hypertrophy, and degenerative disc osteophyte at C5-C6 and C6-C7 resulting in moderate bilateral foraminal narrowing at C6 and C7. No high grade spinal canal stenosis noted.  Patient states she was seen in emergency department in November for increased left sided neck pain after a fall at home. Patient did have cervical MRI while in the ED. Patient was discharged home with Prednisone that seemed to help alleviate her pain, however she reports elevated blood sugar with this medication. Patient states she goes to a Systems analyst several times a week and has been working to lose weight. Patient states she is a very active person, however neck pain pain has become severe and is making it difficult for her to perform daily tasks and exercise. 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 Patient reports issues in the past with chronic lower back pain, states she did receive lumbar epidural injection many years ago at [facility] that did help to alleviate her pain.  Patient denies focal weakness, numbness and tingling. Patient denies recent trauma or falls.   Review of Systems  Musculoskeletal:  Positive for neck pain.  Neurological:  Negative for tingling, sensory change, focal weakness and weakness.  All other systems reviewed and are negative. Otherwise per HPI.  Assessment & Plan: Visit Diagnoses:    ICD-10-CM   1. Cervicalgia  M54.2 Ambulatory referral to Physical Medicine Rehab    2. Radiculopathy, cervical region  M54.12 Ambulatory referral to Physical Medicine Rehab    3. Other spondylosis with myelopathy, cervical region  M47.12     4. Facet hypertrophy of cervical region  M47.812        Plan: Findings:  Chronic, worsening and severe left sided neck pain radiating to left arm. Patient continues to have excruciating pain despite good conservative therapies such as home exercise regimen, rest and use of medications. Patient's clinical presentation and exam are consistent with C5/C6 nerve pattern. We believe the next step is to perform a diagnostic and hopefully therapeutic left C7-T1 interlaminar epidural steroid injection under fluoroscopic guidance. Patient is not currently on long term anticoagulation therapy. If patient does well with epidural injection we will monitor, however if her pain persists we would consider placing a referral with our in-house physical therapy team. We did discuss pain management briefly with patient today, prescription placed for short course of Vicodin today. Patient instructed to take pain medication as directed. Patient also voiced concerns about anxiety related to procedure. We did place a prescription today for oral sedation with Valium for her to  take pre-procedure. Patient encouraged to remain active and to continue performing home exercises as tolerated. No red flag symptoms noted upon exam today.    Meds & Orders:  Meds ordered this encounter  Medications   HYDROcodone-acetaminophen (NORCO/VICODIN) 5-325 MG  tablet    Sig: Take 1 tablet by mouth every 8 (eight) hours as needed for moderate pain or severe pain.    Dispense:  25 tablet    Refill:  0    Order Specific Question:   Supervising Provider    Answer:   Tyrell Antonio [[id number]]   diazepam (VALIUM) 5 MG tablet    Sig: Take one tablet by mouth with food one hour prior to procedure. May repeat 30 minutes prior if needed.    Dispense:  2 tablet    Refill:  0    Order Specific Question:   Supervising Provider    Answer:   Tyrell Antonio [[id number]]    Orders Placed This Encounter  Procedures   Ambulatory referral to Physical Medicine Rehab     Follow-up: Return for Left C7-T1 interlaminar epidural steroid injection.   Procedures: No procedures performed      Clinical History: No specialty comments available.   She reports that she has quit smoking. Her smoking use included cigarettes. She has a 5.00 pack-year smoking history. She has never used smokeless tobacco. No results for input(s): HGBA1C, LABURIC in the last 8760 hours.  Objective:  VS:  HT:     WT:    BMI:      BP:(!) 155/80   HR:81bpm   TEMP: ( )   RESP:  Physical Exam Vitals and nursing note reviewed.  HENT:     Head: Normocephalic and atraumatic.     Right Ear: External ear normal.     Left Ear: External ear normal.     Nose: Nose normal.     Mouth/Throat:     Mouth: Mucous membranes are moist.  Eyes:     Extraocular Movements: Extraocular movements intact.  Cardiovascular:     Rate and Rhythm: Normal rate.     Pulses: Normal pulses.  Pulmonary:     Effort: Pulmonary effort is normal.  Abdominal:     General: Abdomen is flat. There is no distension.  Musculoskeletal:        General: Tenderness present.     Cervical back: Tenderness present.     Comments: Discomfort noted with flexion, extension and side-to-side rotation. Patient has good strength in the upper extremities including 5 out of 5 strength in wrist extension, long finger flexion and APB. There is  no atrophy of the hands intrinsically.  Sensation intact bilaterally. Dysesthesias noted to left C5/C6 dermatomes. Negative Hoffman's sign.   Skin:    General: Skin is warm and dry.     Capillary Refill: Capillary refill takes less than 2 seconds.  Neurological:     General: No focal deficit present.     Mental Status: She is alert and oriented to person, place, and time.  Psychiatric:        Mood and Affect: Mood normal.    Ortho Exam  Imaging: No results found.  Past Medical/Family/Surgical/Social History: Medications & Allergies reviewed per EMR, new medications updated. Patient Active Problem List   Diagnosis Date Noted   Clostridium difficile diarrhea 04/28/2016   Uncontrolled type 2 diabetes mellitus with hyperglycemia, without long-term current use of insulin (HCC) 03/28/2015   Chronic pain syndrome 07/06/2013   Trochanteric bursitis of left hip 07/06/2013  Chronic low back pain 07/06/2013   Arthritis of left knee 04/15/2013   Anxiety and depression 03/08/2011   Past Medical History:  Diagnosis Date   Arthritis    Diabetes mellitus    Hyperlipidemia    Hypertension    Family History  Problem Relation Age of Onset   Colon cancer Neg Hx    Esophageal cancer Neg Hx    Pancreatic cancer Neg Hx    Rectal cancer Neg Hx    Stomach cancer Neg Hx    Past Surgical History:  Procedure Laterality Date   BREAST CYST EXCISION     CARPAL TUNNEL RELEASE     CHOLECYSTECTOMY     KNEE ARTHROSCOPY Left    NECK SURGERY     SPINE SURGERY  2007   cervical   Social History   Occupational History   Not on file  Tobacco Use   Smoking status: Former    Packs/day: 0.50    Years: 10.00    Pack years: 5.00    Types: Cigarettes   Smokeless tobacco: Never   Tobacco comments:    Quit one year ago  Substance and Sexual Activity   Alcohol use: No    Alcohol/week: 0.0 standard drinks   Drug use: No   Sexual activity: Not on file

## 2020-12-27 ENCOUNTER — Telehealth: Payer: Self-pay | Admitting: Physical Medicine and Rehabilitation

## 2020-12-27 NOTE — Telephone Encounter (Signed)
Patient was seen in office yesterday and prescribed hydrocodone. Pharmacy is stating that the medicine is not ordered to be filled until the 23rd. Please follow up with patient or her husband.

## 2020-12-27 NOTE — Telephone Encounter (Signed)
Patient was seen in office today and prescribed Hydrocodone. Pharmacy states medicine cannot be filled until the 23rd. Please follow up with patient or husband.

## 2021-01-17 ENCOUNTER — Other Ambulatory Visit: Payer: Self-pay | Admitting: Internal Medicine

## 2021-01-18 ENCOUNTER — Ambulatory Visit: Payer: Medicare Other | Admitting: Physical Medicine and Rehabilitation

## 2021-01-18 LAB — URINE CULTURE
MICRO NUMBER:: 12826558
SPECIMEN QUALITY:: ADEQUATE

## 2021-01-18 LAB — C. TRACHOMATIS/N. GONORRHOEAE RNA
C. trachomatis RNA, TMA: NOT DETECTED
N. gonorrhoeae RNA, TMA: NOT DETECTED

## 2021-01-22 ENCOUNTER — Ambulatory Visit: Payer: Medicare Other

## 2021-02-07 ENCOUNTER — Encounter: Payer: Self-pay | Admitting: Obstetrics

## 2021-02-07 ENCOUNTER — Ambulatory Visit (INDEPENDENT_AMBULATORY_CARE_PROVIDER_SITE_OTHER): Payer: Medicare Other | Admitting: Obstetrics

## 2021-02-07 ENCOUNTER — Other Ambulatory Visit: Payer: Self-pay

## 2021-02-07 ENCOUNTER — Other Ambulatory Visit (HOSPITAL_COMMUNITY)
Admission: RE | Admit: 2021-02-07 | Discharge: 2021-02-07 | Disposition: A | Discharge status: Home / Self Care | Payer: Medicare Other | Source: Ambulatory Visit | Attending: Obstetrics | Admitting: Obstetrics

## 2021-02-07 VITALS — BP 150/74 | HR 90 | Ht 64.0 in | Wt 210.0 lb

## 2021-02-07 DIAGNOSIS — Z780 Asymptomatic menopausal state: Secondary | ICD-10-CM | POA: Diagnosis not present

## 2021-02-07 DIAGNOSIS — Z01419 Encounter for gynecological examination (general) (routine) without abnormal findings: Secondary | ICD-10-CM | POA: Diagnosis not present

## 2021-02-07 DIAGNOSIS — N898 Other specified noninflammatory disorders of vagina: Secondary | ICD-10-CM

## 2021-02-07 DIAGNOSIS — Z30432 Encounter for removal of intrauterine contraceptive device: Secondary | ICD-10-CM

## 2021-02-07 DIAGNOSIS — E2839 Other primary ovarian failure: Secondary | ICD-10-CM

## 2021-02-07 DIAGNOSIS — Z1239 Encounter for other screening for malignant neoplasm of breast: Secondary | ICD-10-CM

## 2021-02-07 DIAGNOSIS — Z1211 Encounter for screening for malignant neoplasm of colon: Secondary | ICD-10-CM

## 2021-02-07 DIAGNOSIS — Z1212 Encounter for screening for malignant neoplasm of rectum: Secondary | ICD-10-CM

## 2021-02-07 DIAGNOSIS — Z1151 Encounter for screening for human papillomavirus (HPV): Secondary | ICD-10-CM | POA: Insufficient documentation

## 2021-02-07 NOTE — Progress Notes (Signed)
Subjective:        Madeline Guerrero is a 71 y.o. female here for a routine exam.  Current complaints: Malodorous vaginal discharge.  Recently treated for outpatient PID Has had an IUD in for 20 years.  Requests removal of IUD.  Ultrasound done a week ago, ordered by Dr. Jeanie Cooks shows IUD in place, per patient.   Personal health questionnaire:  Is patient Ashkenazi Jewish, have a family history of breast and/or ovarian cancer: no Is there a family history of uterine cancer diagnosed at age < 2, gastrointestinal cancer, urinary tract cancer, family member who is a Field seismologist syndrome-associated carrier: no Is the patient overweight and hypertensive, family history of diabetes, personal history of gestational diabetes, preeclampsia or PCOS: no Is patient over 61, have PCOS,  family history of premature CHD under age 25, diabetes, smoke, have hypertension or peripheral artery disease:  no At any time, has a partner hit, kicked or otherwise hurt or frightened you?: no Over the past 2 weeks, have you felt down, depressed or hopeless?: no Over the past 2 weeks, have you felt little interest or pleasure in doing things?:no   Gynecologic History No LMP recorded. Patient is postmenopausal. Contraception: post menopausal status Last Pap: unknown. Results were: normal Last mammogram: ~ 2 years ago. Results were: normal  Obstetric History OB History  No obstetric history on file.    Past Medical History:  Diagnosis Date   Arthritis    Diabetes mellitus    Hyperlipidemia    Hypertension     Past Surgical History:  Procedure Laterality Date   BREAST CYST EXCISION     CARPAL TUNNEL RELEASE     CHOLECYSTECTOMY     KNEE ARTHROSCOPY Left    NECK SURGERY     SPINE SURGERY  2007   cervical     Current Outpatient Medications:    acetaminophen (TYLENOL) 325 MG tablet, Take by mouth every 6 (six) hours as needed for fever., Disp: , Rfl:    ALPRAZolam (XANAX) 0.5 MG tablet, Take 0.5  mg by mouth 3 (three) times daily as needed for anxiety. , Disp: , Rfl:    Calcium Carbonate (CALCIUM 500 PO), Take 500 mg by mouth daily. Chewable, Disp: , Rfl:    Cholecalciferol (VITAMIN D3 PO), Take 1 tablet by mouth daily., Disp: , Rfl:    [employer] ASPIRIN LOW DOSE 81 MG EC tablet, Take 81 mg by mouth at bedtime. , Disp: , Rfl:    diazepam (VALIUM) 5 MG tablet, Take one tablet by mouth with food one hour prior to procedure. May repeat 30 minutes prior if needed., Disp: 2 tablet, Rfl: 0   DM-Doxylamine-Acetaminophen (NYQUIL COLD & FLU PO), Take 30 mLs by mouth as needed (cough)., Disp: , Rfl:    Fexofenadine HCl (ALLEGRA ALLERGY PO), Take 1 tablet by mouth as needed (congestion)., Disp: , Rfl:    gabapentin (NEURONTIN) 300 MG capsule, TAKE 1 CAPSULE (300 MG TOTAL) BY MOUTH AT BEDTIME., Disp: 90 capsule, Rfl: 0   gabapentin (NEURONTIN) 300 MG capsule, TAKE 1 CAPSULE BY MOUTH EVERYDAY AT BEDTIME, Disp: 90 capsule, Rfl: 2   glipiZIDE (GLUCOTROL XL) 10 MG 24 hr tablet, Take 1 tablet (10 mg total) by mouth daily with breakfast. (Patient taking differently: Take 5 mg by mouth daily with breakfast.), Disp: 39 tablet, Rfl: 0   HYDROcodone-acetaminophen (NORCO/VICODIN) 5-325 MG tablet, Take 1 tablet by mouth every 8 (eight) hours as needed for moderate pain or severe pain., Disp: 25  tablet, Rfl: 0   ipratropium (ATROVENT) 0.06 % nasal spray, Place 2 sprays into both nostrils daily., Disp: , Rfl:    lisinopril-hydrochlorothiazide (ZESTORETIC) 20-25 MG tablet, Take 1 tablet by mouth daily., Disp: , Rfl:    loratadine (CLARITIN) 10 MG tablet, Take 10 mg by mouth as needed (congestion)., Disp: , Rfl:    metFORMIN (GLUCOPHAGE) 1000 MG tablet, Take 1 tablet (1,000 mg total) by mouth 2 (two) times daily with a meal. (Patient taking differently: Take 500 mg by mouth daily with breakfast.), Disp: 180 tablet, Rfl: 3   Multiple Vitamin (MULTIVITAMIN WITH MINERALS) TABS tablet, Take 1 tablet by mouth daily., Disp: , Rfl:     Multiple Vitamins-Minerals (EMERGEN-C IMMUNE PLUS PO), Take 1 tablet by mouth daily., Disp: , Rfl:    pravastatin (PRAVACHOL) 20 MG tablet, Take 1 tablet daily (Patient taking differently: Take 20 mg by mouth every Monday, Wednesday, and Friday.), Disp: 90 tablet, Rfl: 3   zinc sulfate 220 (50 Zn) MG capsule, Take 220 mg by mouth daily., Disp: , Rfl:   Current Facility-Administered Medications:    0.9 %  sodium chloride infusion, 500 mL, Intravenous, Continuous, Danis, Estill Cotta III, MD Allergies  Allergen Reactions   Januvia [Sitagliptin] Swelling    Tongue swelling    Onglyza [Saxagliptin] Swelling   Tradjenta [Linagliptin] Swelling    Tongue and lips   Ultram [Tramadol] Nausea And Vomiting   Nsaids Swelling   Robaxin [Methocarbamol] Swelling    Swelling in her legs.   Tolmetin Swelling   Cefoxitin Itching and Rash   Levaquin [Levofloxacin] Rash    Social History   Tobacco Use   Smoking status: Former    Packs/day: 0.50    Years: 10.00    Pack years: 5.00    Types: Cigarettes   Smokeless tobacco: Never   Tobacco comments:    Quit one year ago  Substance Use Topics   Alcohol use: No    Alcohol/week: 0.0 standard drinks    Family History  Problem Relation Age of Onset   Colon cancer Neg Hx    Esophageal cancer Neg Hx    Pancreatic cancer Neg Hx    Rectal cancer Neg Hx    Stomach cancer Neg Hx       Review of Systems  Constitutional: negative for fatigue and weight loss Respiratory: negative for cough and wheezing Cardiovascular: negative for chest pain, fatigue and palpitations Gastrointestinal: negative for abdominal pain and change in bowel habits Musculoskeletal:negative for myalgias Neurological: negative for gait problems and tremors Behavioral/Psych: negative for abusive relationship, depression Endocrine: negative for temperature intolerance    Genitourinary:negative for abnormal menstrual periods, genital lesions, hot flashes, sexual problems and  vaginal discharge Integument/breast: negative for breast lump, breast tenderness, nipple discharge and skin lesion(s)    Objective:       There were no vitals taken for this visit. General:   alert  Skin:   no rash or abnormalities  Lungs:   clear to auscultation bilaterally  Heart:   regular rate and rhythm, S1, S2 normal, no murmur, click, rub or gallop  Breasts:   normal without suspicious masses, skin or nipple changes or axillary nodes  Abdomen:  normal findings: no organomegaly, soft, non-tender and no hernia  Pelvis:  External genitalia: normal general appearance Urinary system: urethral meatus normal and bladder without fullness, nontender Vaginal: normal without tenderness, induration or masses Cervix: normal appearance Adnexa: normal bimanual exam Uterus: anteverted and non-tender, normal size   Lab  Review Urine pregnancy test Labs reviewed yes Radiologic studies reviewed yes  I have spent a total of 30 minutes of face-to-face time, excluding clinical staff time, reviewing notes and preparing to see patient, ordering tests and/or medications, and counseling the patient.   Assessment:    1. Encounter for routine gynecological examination with Papanicolaou smear of cervix Rx: - Cytology - PAP( [facility])  2. Menopause  3. Hypoestrogenism  4. Screening breast examination Rx: - MM Digital Screening; Future  5. Screening for colorectal cancer Rx: - DG BONE DENSITY (DXA); Future  6. Encounter for IUD removal - IUD removed, intact  7. Vaginal discharge Rx: - Cervicovaginal ancillary only( [facility])     Plan:    Education reviewed: calcium supplements, depression evaluation, low fat, low cholesterol diet, safe sex/STD prevention, self breast exams, and weight bearing exercise. Mammogram ordered. Follow up in: 2 weeks. Bone Destiny study ordered       Shelly Bombard, MD 02/07/2021 8:37 AM        GYNECOLOGY OFFICE PROCEDURE NOTE  Madeline Guerrero is a 71 y.o. No obstetric history on file. here for [place] IUD removal. No GYN concerns.  Last pap smear was > 3 years ago and was normal.  IUD Removal  Patient identified, informed consent performed, consent signed.  Patient was in the dorsal lithotomy position, normal external genitalia was noted.  A speculum was placed in the patient's vagina, normal discharge was noted, no lesions. The cervix was visualized, no lesions, no abnormal discharge.  The strings of the IUD were grasped and pulled using ring forceps. The IUD was removed in its entirety.  Patient tolerated the procedure well.    Patient will use condoms for contraception.  Routine preventative health maintenance measures emphasized.   Shelly Bombard, MD, [place] [facility], [place], [place] 02/07/21

## 2021-02-07 NOTE — Progress Notes (Signed)
NEW GYN presents for IUD removal. Last PAP was more than 3 years ago and mammogram was 2 years ago.

## 2021-02-08 LAB — CYTOLOGY - PAP
Comment: NEGATIVE
Diagnosis: NEGATIVE
High risk HPV: NEGATIVE

## 2021-02-08 LAB — CERVICOVAGINAL ANCILLARY ONLY
Bacterial Vaginitis (gardnerella): NEGATIVE
Candida Glabrata: NEGATIVE
Candida Vaginitis: NEGATIVE
Chlamydia: NEGATIVE
Comment: NEGATIVE
Comment: NEGATIVE
Comment: NEGATIVE
Comment: NEGATIVE
Comment: NEGATIVE
Comment: NORMAL
Neisseria Gonorrhea: NEGATIVE
Trichomonas: NEGATIVE

## 2021-02-21 ENCOUNTER — Telehealth: Payer: No Typology Code available for payment source | Admitting: Obstetrics

## 2021-03-13 ENCOUNTER — Ambulatory Visit: Payer: No Typology Code available for payment source | Admitting: Obstetrics

## 2021-10-04 ENCOUNTER — Other Ambulatory Visit: Payer: Self-pay | Admitting: Internal Medicine

## 2021-10-05 LAB — COMPLETE METABOLIC PANEL WITH GFR
AG Ratio: 1.6 (calc) (ref 1.0–2.5)
ALT: 17 U/L (ref 6–29)
AST: 31 U/L (ref 10–35)
Albumin: 4.2 g/dL (ref 3.6–5.1)
Alkaline phosphatase (APISO): 92 U/L (ref 37–153)
BUN/Creatinine Ratio: 15 (calc) (ref 6–22)
BUN: 31 mg/dL — ABNORMAL HIGH (ref 7–25)
CO2: 25 mmol/L (ref 20–32)
Calcium: 10 mg/dL (ref 8.6–10.4)
Chloride: 100 mmol/L (ref 98–110)
Creat: 2.01 mg/dL — ABNORMAL HIGH (ref 0.60–1.00)
Globulin: 2.6 g/dL (calc) (ref 1.9–3.7)
Glucose, Bld: 99 mg/dL (ref 65–99)
Potassium: 3.7 mmol/L (ref 3.5–5.3)
Sodium: 138 mmol/L (ref 135–146)
Total Bilirubin: 0.4 mg/dL (ref 0.2–1.2)
Total Protein: 6.8 g/dL (ref 6.1–8.1)
eGFR: 26 mL/min/{1.73_m2} — ABNORMAL LOW (ref >=60)

## 2021-10-05 LAB — LIPID PANEL
Cholesterol: 166 mg/dL (ref <200)
HDL: 77 mg/dL (ref >=50)
LDL Cholesterol (Calc): 74 mg/dL (calc)
Non-HDL Cholesterol (Calc): 89 mg/dL (calc) (ref <130)
Total CHOL/HDL Ratio: 2.2 (calc) (ref <5.0)
Triglycerides: 73 mg/dL (ref <150)

## 2021-10-05 LAB — CBC
HCT: 28.5 % — ABNORMAL LOW (ref 35.0–45.0)
Hemoglobin: 9.9 g/dL — ABNORMAL LOW (ref 11.7–15.5)
MCH: 32.4 pg (ref 27.0–33.0)
MCHC: 34.7 g/dL (ref 32.0–36.0)
MCV: 93.1 fL (ref 80.0–100.0)
MPV: 11 fL (ref 7.5–12.5)
Platelets: 241 10*3/uL (ref 140–400)
RBC: 3.06 10*6/uL — ABNORMAL LOW (ref 3.80–5.10)
RDW: 12.6 % (ref 11.0–15.0)
WBC: 5.1 10*3/uL (ref 3.8–10.8)

## 2021-10-05 LAB — TSH: TSH: 1.18 mIU/L (ref 0.40–4.50)

## 2021-10-05 LAB — VITAMIN D 25 HYDROXY (VIT D DEFICIENCY, FRACTURES): Vit D, 25-Hydroxy: 68 ng/mL (ref 30–100)

## 2022-03-27 ENCOUNTER — Other Ambulatory Visit: Payer: Self-pay | Admitting: Internal Medicine

## 2022-03-28 LAB — BASIC METABOLIC PANEL WITH GFR
BUN/Creatinine Ratio: 24 (calc) — ABNORMAL HIGH (ref 6–22)
BUN: 29 mg/dL — ABNORMAL HIGH (ref 7–25)
CO2: 23 mmol/L (ref 20–32)
Calcium: 9.8 mg/dL (ref 8.6–10.4)
Chloride: 103 mmol/L (ref 98–110)
Creat: 1.19 mg/dL — ABNORMAL HIGH (ref 0.60–1.00)
Glucose, Bld: 98 mg/dL (ref 65–99)
Potassium: 3.6 mmol/L (ref 3.5–5.3)
Sodium: 141 mmol/L (ref 135–146)
eGFR: 49 mL/min/{1.73_m2} — ABNORMAL LOW (ref >=60)

## 2022-03-28 LAB — CBC
HCT: 30.7 % — ABNORMAL LOW (ref 35.0–45.0)
Hemoglobin: 10.3 g/dL — ABNORMAL LOW (ref 11.7–15.5)
MCH: 31.7 pg (ref 27.0–33.0)
MCHC: 33.6 g/dL (ref 32.0–36.0)
MCV: 94.5 fL (ref 80.0–100.0)
MPV: 10.8 fL (ref 7.5–12.5)
Platelets: 272 10*3/uL (ref 140–400)
RBC: 3.25 10*6/uL — ABNORMAL LOW (ref 3.80–5.10)
RDW: 12.3 % (ref 11.0–15.0)
WBC: 4.5 10*3/uL (ref 3.8–10.8)

## 2022-05-08 ENCOUNTER — Ambulatory Visit: Payer: Medicare Other | Admitting: Orthopedic Surgery

## 2022-05-08 ENCOUNTER — Other Ambulatory Visit (INDEPENDENT_AMBULATORY_CARE_PROVIDER_SITE_OTHER): Payer: Medicare Other

## 2022-05-08 ENCOUNTER — Telehealth: Payer: Medicare Other

## 2022-05-08 DIAGNOSIS — M25562 Pain in left knee: Secondary | ICD-10-CM

## 2022-05-08 DIAGNOSIS — M1712 Unilateral primary osteoarthritis, left knee: Secondary | ICD-10-CM | POA: Diagnosis not present

## 2022-05-08 NOTE — Telephone Encounter (Signed)
Auth needed for left knee gel 

## 2022-05-09 NOTE — Telephone Encounter (Signed)
VOB submitted for Durolane, left knee.  

## 2022-05-10 ENCOUNTER — Encounter: Payer: Self-pay | Admitting: Orthopedic Surgery

## 2022-05-10 MED ORDER — METHYLPREDNISOLONE ACETATE 40 MG/ML IJ SUSP
40.0000 mg | INTRAMUSCULAR | Status: AC | PRN
  Administered 2022-05-08: 40 mg via INTRA_ARTICULAR

## 2022-05-10 MED ORDER — BUPIVACAINE HCL 0.25 % IJ SOLN
4.0000 mL | INTRAMUSCULAR | Status: AC | PRN
  Administered 2022-05-08: 4 mL via INTRA_ARTICULAR

## 2022-05-10 MED ORDER — LIDOCAINE HCL 1 % IJ SOLN
5.0000 mL | INTRAMUSCULAR | Status: AC | PRN
  Administered 2022-05-08: 5 mL

## 2022-05-10 NOTE — Progress Notes (Signed)
Office Visit Note   Patient: Madeline Guerrero           Date of Birth: 02-21-1950           MRN: [id number] Visit Date: 05/08/2022 Requested by: Fleet Contras, MD [address] [place],  [place] [address] PCP: Fleet Contras, MD  Subjective: Chief Complaint  Patient presents with   Left Knee - Pain    HPI: Madeline Guerrero is a 72 y.o. female who presents to the office reporting left knee pain since last Tuesday.  She was knocked down into the wall.  Her dog was running fast and hit her.  The dog weighs about 100 pounds.  Describes some weakness and giving way in that left knee.  Reports some medial pain but no locking or popping.  Takes Exer strength Tylenol without much relief.  She cannot really take anti-inflammatories because she swells some..                ROS: All systems reviewed are negative as they relate to the chief complaint within the history of present illness.  Patient denies fevers or chills.  Assessment & Plan: Visit Diagnoses:  1. Left knee pain, unspecified chronicity     Plan: Impression is left knee pain with exacerbation of pre-existing arthritis following impact injury.  Collateral crucial ligaments feel stable.  Injection of the left knee is performed today for pain relief.  Will see how she does with that intervention.  Follow-up in 6 to 8 weeks for repeat clinical evaluation if symptoms or not improved.  Follow-Up Instructions: No follow-ups on file.   Orders:  Orders Placed This Encounter  Procedures   XR KNEE 3 VIEW LEFT   No orders of the defined types were placed in this encounter.     Procedures: Large Joint Inj: L knee on 05/08/2022 5:13 PM Indications: diagnostic evaluation, joint swelling and pain Details: 18 G 1.5 in needle, superolateral approach  Arthrogram: No  Medications: 5 mL lidocaine 1 %; 40 mg methylPREDNISolone acetate 40 MG/ML; 4 mL bupivacaine 0.25 % Outcome: tolerated well, no immediate  complications Procedure, treatment alternatives, risks and benefits explained, specific risks discussed. Consent was given by the patient. Immediately prior to procedure a time out was called to verify the correct patient, procedure, equipment, support staff and site/side marked as required. Patient was prepped and draped in the usual sterile fashion.       Clinical Data: No additional findings.  Objective: Vital Signs: There were no vitals taken for this visit.  Physical Exam:  Constitutional: Patient appears well-developed HEENT:  Head: Normocephalic Eyes:EOM are normal Neck: Normal range of motion Cardiovascular: Normal rate Pulmonary/chest: Effort normal Neurologic: Patient is alert Skin: Skin is warm Psychiatric: Patient has normal mood and affect  Ortho Exam: Ortho exam demonstrates full active and passive range of motion in that left knee.  No effusion.  Collateral and cruciate ligaments are stable.  Extensor mechanism intact.  Medial joint line tenderness is present.  Equivocal McMurray compression testing.  Specialty Comments:  No specialty comments available.  Imaging: No results found.   PMFS History: Patient Active Problem List   Diagnosis Date Noted   Clostridium difficile diarrhea 04/28/2016   Uncontrolled type 2 diabetes mellitus with hyperglycemia, without long-term current use of insulin (HCC) 03/28/2015   Chronic pain syndrome 07/06/2013   Trochanteric bursitis of left hip 07/06/2013   Chronic low back pain 07/06/2013   Arthritis of left knee 04/15/2013   Anxiety  and depression 03/08/2011   Past Medical History:  Diagnosis Date   Anemia    Arthritis    Diabetes mellitus    Hyperlipidemia    Hypertension     Family History  Problem Relation Age of Onset   Colon cancer Neg Hx    Esophageal cancer Neg Hx    Pancreatic cancer Neg Hx    Rectal cancer Neg Hx    Stomach cancer Neg Hx     Past Surgical History:  Procedure Laterality Date    BREAST CYST EXCISION     CARPAL TUNNEL RELEASE     CHOLECYSTECTOMY     KNEE ARTHROSCOPY Left    NECK SURGERY     SPINE SURGERY  2007   cervical   Social History   Occupational History   Not on file  Tobacco Use   Smoking status: Former    Packs/day: 0.50    Years: 10.00    Additional pack years: 0.00    Total pack years: 5.00    Types: Cigarettes   Smokeless tobacco: Never   Tobacco comments:    Quit one year ago  Vaping Use   Vaping Use: Never used  Substance and Sexual Activity   Alcohol use: No    Alcohol/week: 0.0 standard drinks of alcohol   Drug use: No   Sexual activity: Yes

## 2022-08-30 ENCOUNTER — Other Ambulatory Visit (INDEPENDENT_AMBULATORY_CARE_PROVIDER_SITE_OTHER): Payer: Medicare Other

## 2022-08-30 ENCOUNTER — Ambulatory Visit: Payer: Medicare Other | Admitting: Orthopedic Surgery

## 2022-08-30 ENCOUNTER — Encounter: Payer: Self-pay | Admitting: Orthopedic Surgery

## 2022-08-30 DIAGNOSIS — M79604 Pain in right leg: Secondary | ICD-10-CM | POA: Diagnosis not present

## 2022-08-30 NOTE — Progress Notes (Signed)
Office Visit Note   Patient: Madeline Guerrero           Date of Birth: 10/08/1950           MRN: [id number] Visit Date: 08/30/2022 Requested by: Fleet Contras, MD [address] [place],  [place] [address] PCP: Fleet Contras, MD  Subjective: Chief Complaint  Patient presents with   Right Leg - Pain    HPI: Madeline Guerrero is a 72 y.o. female who presents to the office reporting 39-month history of right buttock and leg pain with some involvement in ADLs.  Hurts her to step up on the stairs.  Localizes some pain to the ischial tuberosity region.  She does report some numbness in the posterior lateral aspect of that leg radiating down to the knee but not below the knee.  She also describes low back pain but no groin pain.  Pain does wake her from sleep at night.  Has a lot of pain with prolonged sitting.  When she is up walking around it feels little bit better.  She feels like the right leg is weak.  She has been recommended to take prednisone but did not take it because it causes her blood glucose to go too high.  Denies any left leg symptoms..                ROS: All systems reviewed are negative as they relate to the chief complaint within the history of present illness.  Patient denies fevers or chills.  Assessment & Plan: Visit Diagnoses:  1. Pain in right leg     Plan: Impression is right buttock and leg pain which looks like it could be hamstring insertional tendinitis versus radiculopathy.  Symptoms ongoing for 2 months with no resolution with medication activity modification as well as stretching and exercise program.  Recommend MRI lumbar spine to evaluate right-sided radiculopathy along with MRI pelvis to evaluate right-sided ischial tuberosity hamstring attachments.  Quite tender to palpation in this region.  Follow-up after those studies.  She is currently taking Celebrex for the problem.  Follow-Up Instructions: No follow-ups on file.   Orders:   Orders Placed This Encounter  Procedures   XR Lumbar Spine 2-3 Views   MR Pelvis w/o contrast   MR Lumbar Spine w/o contrast   No orders of the defined types were placed in this encounter.     Procedures: No procedures performed   Clinical Data: No additional findings.  Objective: Vital Signs: There were no vitals taken for this visit.  Physical Exam:  Constitutional: Patient appears well-developed HEENT:  Head: Normocephalic Eyes:EOM are normal Neck: Normal range of motion Cardiovascular: Normal rate Pulmonary/chest: Effort normal Neurologic: Patient is alert Skin: Skin is warm Psychiatric: Patient has normal mood and affect  Ortho Exam: Ortho exam demonstrates slightly antalgic gait to the right.  Does have tenderness to palpation of the right ischial tuberosity.  Ankle dorsiflexion plantarflexion strength is intact.  Positive nerve root tension signs on the right negative on the left.  She does have some paresthesias in the lateral thigh on the right compared to the left but no tenderness over the ASIS on the right.  Pedal pulses palpable.  No groin pain with internal/external rotation of either leg.  She does have some pain with resisted hamstring flexion on the right but not on the left.  No masses palpable in this region.  No trochanteric tenderness present.  Specialty Comments:  No specialty comments available.  Imaging:  XR Lumbar Spine 2-3 Views  Result Date: 08/30/2022 AP lateral radiographs lumbar spine reviewed.  Visualized hips with no degenerative changes.  Normal lordosis.  Moderate to severe facet arthritis is present throughout the lumbar spine.  Degenerative disc changes are minimal with minimal loss of height in the lower lumbar spine region.  Anterior spurring is present.  No spondylolisthesis or compression fractures.    PMFS History: Patient Active Problem List   Diagnosis Date Noted   Clostridium difficile diarrhea 04/28/2016   Uncontrolled type  2 diabetes mellitus with hyperglycemia, without long-term current use of insulin (HCC) 03/28/2015   Chronic pain syndrome 07/06/2013   Trochanteric bursitis of left hip 07/06/2013   Chronic low back pain 07/06/2013   Arthritis of left knee 04/15/2013   Anxiety and depression 03/08/2011   Past Medical History:  Diagnosis Date   Anemia    Arthritis    Diabetes mellitus    Hyperlipidemia    Hypertension     Family History  Problem Relation Age of Onset   Colon cancer Neg Hx    Esophageal cancer Neg Hx    Pancreatic cancer Neg Hx    Rectal cancer Neg Hx    Stomach cancer Neg Hx     Past Surgical History:  Procedure Laterality Date   BREAST CYST EXCISION     CARPAL TUNNEL RELEASE     CHOLECYSTECTOMY     KNEE ARTHROSCOPY Left    NECK SURGERY     SPINE SURGERY  2007   cervical   Social History   Occupational History   Not on file  Tobacco Use   Smoking status: Former    Current packs/day: 0.50    Average packs/day: 0.5 packs/day for 10.0 years (5.0 ttl pk-yrs)    Types: Cigarettes   Smokeless tobacco: Never   Tobacco comments:    Quit one year ago  Vaping Use   Vaping status: Never Used  Substance and Sexual Activity   Alcohol use: No    Alcohol/week: 0.0 standard drinks of alcohol   Drug use: No   Sexual activity: Yes

## 2022-09-06 ENCOUNTER — Other Ambulatory Visit: Payer: Medicare Other

## 2022-09-18 ENCOUNTER — Ambulatory Visit: Payer: Medicare Other | Admitting: Orthopedic Surgery

## 2022-11-29 ENCOUNTER — Telehealth: Payer: Self-pay | Admitting: Orthopedic Surgery

## 2022-11-29 NOTE — Telephone Encounter (Signed)
Patient called and said that she needs him to put the request for the MRI. CB#[phone]

## 2022-11-29 NOTE — Telephone Encounter (Signed)
Received vm from patients husband requesting copy of records for patient. IC patient,lmvm advised need to sign authorization to release medical records. [phone]

## 2022-12-02 NOTE — Telephone Encounter (Signed)
Lvm advising pt.

## 2022-12-22 ENCOUNTER — Ambulatory Visit
Admission: RE | Admit: 2022-12-22 | Discharge: 2022-12-22 | Disposition: A | Discharge status: Home / Self Care | Payer: Medicare Other | Source: Ambulatory Visit | Attending: Orthopedic Surgery | Admitting: Orthopedic Surgery

## 2022-12-22 DIAGNOSIS — M79604 Pain in right leg: Secondary | ICD-10-CM

## 2023-01-27 ENCOUNTER — Encounter: Payer: Self-pay | Admitting: Orthopedic Surgery

## 2023-01-27 ENCOUNTER — Ambulatory Visit: Payer: Medicare Other | Admitting: Orthopedic Surgery

## 2023-01-27 DIAGNOSIS — G8929 Other chronic pain: Secondary | ICD-10-CM | POA: Diagnosis not present

## 2023-01-27 DIAGNOSIS — M5441 Lumbago with sciatica, right side: Secondary | ICD-10-CM | POA: Diagnosis not present

## 2023-01-28 ENCOUNTER — Encounter: Payer: Self-pay | Admitting: Orthopedic Surgery

## 2023-01-28 ENCOUNTER — Other Ambulatory Visit: Payer: Self-pay

## 2023-01-28 DIAGNOSIS — M5416 Radiculopathy, lumbar region: Secondary | ICD-10-CM

## 2023-01-28 NOTE — Progress Notes (Signed)
 Office Visit Note   Patient: Madeline Guerrero           Date of Birth: September 16, 1950           MRN: [id number] Visit Date: 01/27/2023 Requested by: Shelda Atlas, MD [address] [place],  [place] [address] PCP: Shelda Atlas, MD  Subjective: Chief Complaint  Patient presents with   Other    Review MRI    HPI: [name] is a 73 y.o. female who presents to the office reporting continued pain in her back and right leg.  Since she was last seen she has been going to the pain clinic.  Taking Percocet twice a day.  States that she hurts all the time.  Since she was last seen she had an MRI scan of the pelvis which shows no acute fracture dislocation or avascular necrosis with mild arthritis of both hips and the small degenerative tear of the right hamstring origin.SABRA  MRI scan of the lumbar spine demonstrates severe right and moderate left facet arthropathy at L3-4.  That is the most significant finding in the lumbar spine.              ROS: All systems reviewed are negative as they relate to the chief complaint within the history of present illness.  Patient denies fevers or chills.  Assessment & Plan: Visit Diagnoses:  1. Chronic bilateral low back pain with right-sided sciatica     Plan: Impression is low back pain with right-sided facet arthropathy at L3-4.  Does not look like this is a surgical problem.  I think she would do well with an epidural steroid injection.  We will refer her to [facility] imaging for that.  She may consider looking at cervical spine injections as well.  Knees are relatively stable at this time and the hamstring finding on the right is not a surgical problem.  Follow-Up Instructions: No follow-ups on file.   Orders:  No orders of the defined types were placed in this encounter.  No orders of the defined types were placed in this encounter.     Procedures: No procedures performed   Clinical Data: No additional  findings.  Objective: Vital Signs: There were no vitals taken for this visit.  Physical Exam:  Constitutional: Patient appears well-developed HEENT:  Head: Normocephalic Eyes:EOM are normal Neck: Normal range of motion Cardiovascular: Normal rate Pulmonary/chest: Effort normal Neurologic: Patient is alert Skin: Skin is warm Psychiatric: Patient has normal mood and affect  Ortho Exam: Ortho exam demonstrates excellent hamstring strength bilaterally with no pain with resisted knee flexion.  No nerve root tension signs right left-hand side.  Paresthesias L1 S1 bilaterally.  No groin pain or restriction range of motion with internal/external rotation of either leg.  Does have some pain with forward lateral bending.  No definite trochanteric tenderness.  Pedal pulses palpable.  Ankle dorsiflexion plantarflexion quad hamstring strength 5+ out of 5 in hip flexion strength also 5+ out of 5 on the left 5 out of 5 on the right.  Specialty Comments:  No specialty comments available.  Imaging: No results found.   PMFS History: Patient Active Problem List   Diagnosis Date Noted   Clostridium difficile diarrhea 04/28/2016   Uncontrolled type 2 diabetes mellitus with hyperglycemia, without long-term current use of insulin  (HCC) 03/28/2015   Chronic pain syndrome 07/06/2013   Trochanteric bursitis of left hip 07/06/2013   Chronic low back pain 07/06/2013   Arthritis of left knee 04/15/2013  Anxiety and depression 03/08/2011   Past Medical History:  Diagnosis Date   Anemia    Arthritis    Diabetes mellitus    Hyperlipidemia    Hypertension     Family History  Problem Relation Age of Onset   Colon cancer Neg Hx    Esophageal cancer Neg Hx    Pancreatic cancer Neg Hx    Rectal cancer Neg Hx    Stomach cancer Neg Hx     Past Surgical History:  Procedure Laterality Date   BREAST CYST EXCISION     CARPAL TUNNEL RELEASE     CHOLECYSTECTOMY     KNEE ARTHROSCOPY Left    NECK  SURGERY     SPINE SURGERY  2007   cervical   Social History   Occupational History   Not on file  Tobacco Use   Smoking status: Former    Current packs/day: 0.50    Average packs/day: 0.5 packs/day for 10.0 years (5.0 ttl pk-yrs)    Types: Cigarettes   Smokeless tobacco: Never   Tobacco comments:    Quit one year ago  Vaping Use   Vaping status: Never Used  Substance and Sexual Activity   Alcohol use: No    Alcohol/week: 0.0 standard drinks of alcohol   Drug use: No   Sexual activity: Yes

## 2023-02-17 ENCOUNTER — Other Ambulatory Visit: Payer: Self-pay | Admitting: Radiology

## 2023-02-17 DIAGNOSIS — M5416 Radiculopathy, lumbar region: Secondary | ICD-10-CM

## 2023-02-24 NOTE — Discharge Instructions (Signed)

## 2023-02-25 ENCOUNTER — Inpatient Hospital Stay
Admission: RE | Admit: 2023-02-25 | Discharge: 2023-02-25 | Disposition: A | Discharge status: Home / Self Care | Payer: Medicare Other | Source: Ambulatory Visit | Attending: Orthopedic Surgery | Admitting: Orthopedic Surgery

## 2023-02-26 NOTE — Discharge Instructions (Signed)

## 2023-02-27 ENCOUNTER — Ambulatory Visit
Admission: RE | Admit: 2023-02-27 | Discharge: 2023-02-27 | Discharge status: Home / Self Care | Payer: Medicare Other | Source: Ambulatory Visit | Attending: Orthopedic Surgery | Admitting: Orthopedic Surgery

## 2023-02-27 DIAGNOSIS — M5416 Radiculopathy, lumbar region: Secondary | ICD-10-CM

## 2023-02-27 MED ORDER — IOPAMIDOL (ISOVUE-M 200) INJECTION 41%
1.0000 mL | Freq: Once | INTRAMUSCULAR | Status: AC
  Administered 2023-02-27: 1 mL via INTRA_ARTICULAR

## 2023-02-27 MED ORDER — METHYLPREDNISOLONE ACETATE 40 MG/ML INJ SUSP (RADIOLOG
60.0000 mg | Freq: Once | INTRAMUSCULAR | Status: AC
  Administered 2023-02-27: 60 mg via INTRA_ARTICULAR

## 2023-04-07 ENCOUNTER — Ambulatory Visit: Admitting: Orthopedic Surgery

## 2023-11-17 ENCOUNTER — Encounter: Payer: Self-pay | Admitting: Radiology
